# Patient Record
Sex: Female | Born: 1985 | Hispanic: No | Marital: Married | Smoking: Never smoker
Health system: Southern US, Community
[De-identification: ages and names within clinical notes are randomized; demographics above are authoritative.]

## PROBLEM LIST (undated history)

## (undated) DIAGNOSIS — N750 Cyst of Bartholin's gland: Secondary | ICD-10-CM

## (undated) DIAGNOSIS — Z87440 Personal history of urinary (tract) infections: Secondary | ICD-10-CM

## (undated) DIAGNOSIS — O139 Gestational [pregnancy-induced] hypertension without significant proteinuria, unspecified trimester: Secondary | ICD-10-CM

## (undated) DIAGNOSIS — N390 Urinary tract infection, site not specified: Secondary | ICD-10-CM

## (undated) HISTORY — PX: ABCESS DRAINAGE: SHX399

## (undated) HISTORY — DX: Personal history of urinary (tract) infections: Z87.440

---
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
## 2015-02-07 ENCOUNTER — Ambulatory Visit (INDEPENDENT_AMBULATORY_CARE_PROVIDER_SITE_OTHER): Payer: BLUE CROSS/BLUE SHIELD | Admitting: Obstetrics & Gynecology

## 2015-02-07 ENCOUNTER — Encounter: Payer: Self-pay | Admitting: Obstetrics & Gynecology

## 2015-02-07 VITALS — BP 127/81 | HR 110 | Ht 63.0 in | Wt 219.0 lb

## 2015-02-07 DIAGNOSIS — N750 Cyst of Bartholin's gland: Secondary | ICD-10-CM | POA: Diagnosis not present

## 2015-02-07 MED ORDER — TRAMADOL HCL 50 MG PO TABS: 50.0000 mg | ORAL_TABLET | Freq: Four times a day (QID) | ORAL | Status: DC | PRN

## 2015-02-07 MED ORDER — CEPHALEXIN 500 MG PO CAPS: 500.0000 mg | ORAL_CAPSULE | Freq: Four times a day (QID) | ORAL | Status: DC

## 2015-02-07 NOTE — Patient Instructions (Signed)
Bartholin's Cyst or Abscess °Bartholin's glands are small glands located within the folds of skin (labia) along the sides of the lower opening of the vagina (birth canal). A cyst may develop when the duct of the gland becomes blocked. When this happens, fluid that accumulates within the cyst can become infected. This is known as an abscess. The Bartholin gland produces a mucous fluid to lubricate the outside of the vagina during sexual intercourse. °SYMPTOMS  °· Patients with a small cyst may not have any symptoms. °· Mild discomfort to severe pain depending on the size of the cyst and if it is infected (abscess). °· Pain, redness, and swelling around the lower opening of the vagina. °· Painful intercourse. °· Pressure in the perineal area. °· Swelling of the lips of the vagina (labia). °· The cyst or abscess can be on one side or both sides of the vagina. °DIAGNOSIS  °· A large swelling is seen in the lower vagina area by your caregiver. °· Painful to touch. °· Redness and pain, if it is an abscess. °TREATMENT  °· Sometimes the cyst will go away on its own. °· Apply warm wet compresses to the area or take hot sitz baths several times a day. °· An incision to drain the cyst or abscess with local anesthesia. °· Culture the pus, if it is an abscess. °· Antibiotic treatment, if it is an abscess. °· Cut open the gland and suture the edges to make the opening of the gland bigger (marsupialization). °· Remove the whole gland if the cyst or abscess returns. °PREVENTION  °· Practice good hygiene. °· Clean the vaginal area with a mild soap and soft cloth when bathing. °· Do not rub hard in the vaginal area when bathing. °· Protect the crotch area with a padded cushion if you take long bike rides or ride horses. °· Be sure you are well lubricated when you have sexual intercourse. °HOME CARE INSTRUCTIONS  °· If your cyst or abscess was opened, a small piece of gauze, or a drain, may have been placed in the wound to allow  drainage. Do not remove this gauze or drain unless directed by your caregiver. °· Wear feminine pads, not tampons, as needed for any drainage or bleeding. °· If antibiotics were prescribed, take them exactly as directed. Finish the entire course. °· Only take over-the-counter or prescription medicines for pain, discomfort, or fever as directed by your caregiver. °SEEK IMMEDIATE MEDICAL CARE IF:  °· You have an increase in pain, redness, swelling, or drainage. °· You have bleeding from the wound which results in the use of more than the number of pads suggested by your caregiver in 24 hours. °· You have chills. °· You have a fever. °· You develop any new problems (symptoms) or aggravation of your existing condition. °MAKE SURE YOU:  °· Understand these instructions. °· Will watch your condition. °· Will get help right away if you are not doing well or get worse. °Document Released: 04/30/2005 Document Revised: 07/23/2011 Document Reviewed: 12/17/2007 °ExitCare® Patient Information ©2015 [employer]. This information is not intended to replace advice given to you by your health care provider. Make sure you discuss any questions you have with your health care provider. ° °

## 2015-02-07 NOTE — Progress Notes (Signed)
Patient is new patient and has never been to our office. Patient called after hours line this weekend and complained of vaginal pain that has worsened the last two- three days. Patient also not had a period since July and took a pregnancy test yesterday and it was positive. Armandina Stammer RN BSN

## 2015-02-07 NOTE — Progress Notes (Signed)
CLINIC ENCOUNTER NOTE  History:  29 y.o. G1P0 here today for evaluation of worsening vaginal cyst.  Cyst was noted four days ago.  It has become bigger over time, making it hard to sit or walk. It is very tender. She denies any fevers, abnormal vaginal discharge, bleeding, pelvic pain or other concerns.   Of note, patient reported LMP of 11/29/14 and had a positive home UPT; this makes here exactly [redacted]w[redacted]d today.  She is usually followed at [facility]; unsure if she will go there or here for prenatal care  History reviewed. No pertinent past medical history.  History reviewed. No pertinent past surgical history.  The following portions of the patient's history were reviewed and updated as appropriate: allergies, current medications, past family history, past medical history, past social history, past surgical history and problem list.   Health Maintenance:  Normal pap last year  Review of Systems:  Pertinent items are noted in HPI. Comprehensive review of systems was otherwise negative.  Objective:  Physical Exam BP 127/81 mmHg  Pulse 110  Ht  (1.6 m)  Wt 219 lb (99.338 kg)  BMI 38.80 kg/m2  LMP 11/29/2014 (Within Weeks) CONSTITUTIONAL: Well-developed, well-nourished female in no acute distress.  HENT:  Normocephalic, atraumatic. External right and left ear normal. Oropharynx is clear and moist EYES: Conjunctivae and EOM are normal. Pupils are equal, round, and reactive to light. No scleral icterus.  NECK: Normal range of motion, supple, no masses SKIN: Skin is warm and dry. No rash noted. Not diaphoretic. No erythema. No pallor. NEUROLGIC: Alert and oriented to person, place, and time. Normal reflexes, muscle tone coordination. No cranial nerve deficit noted. PSYCHIATRIC: Normal mood and affect. Normal behavior. Normal judgment and thought content. CARDIOVASCULAR: Normal heart rate noted RESPIRATORY: Effort and breath sounds normal, no problems with respiration noted ABDOMEN:  Soft, no distention noted.   MUSCULOSKELETAL: Normal range of motion. No edema noted. PELVIC: Normal appearing external genitalia except enlarged left Bartholin's gland measuring about 3 cm in size.  Very tender to touch, mild surrounding erythema noted.  Fluctuance of cyst noted.  No abnormal discharge noted.     Bartholin Gland Cyst I&D Enlarged abscess palpated in front of the hymenal ring around 5 o' clock.  Written informed consent was obtained.  Discussed complications and possible outcomes of procedure including recurrence of cyst, scarring leading to infection, bleeding, dyspareunia, distortion of anatomy.  Patient was examined in the dorsal lithotomy position and mass was identified.  The area was prepped with Iodine and draped in a sterile manner. 1% Lidocaine (3 ml) was then used to infiltrate area on top of the cyst, behind the hymenal ring.  A 7 mm incision was made using a sterile scapel. Upon palpation of the mass, a moderate amount of bloody, purulent, malodorous drainage was expressed through the incision.  Patient tolerated the procedure well, reported feeling " a lot better."   Assessment & Plan:  Bartholin's gland cyst now s/p I&D - Keflex 500 mg po qid  x 7 days for treatment - Recommended Sitz baths prn; Tylenol and Toradol (prescribed)  prn pain.   She was told to call to be examined if she experiences increasing swelling, pain, vaginal discharge, or fever.  - She was instructed to wear a peripad to absorb discharge, and to maintain pelvic rest  - She will return in 2 weeks for reevaluation; earlier if needed. Advised to start taking prenatal vitamins and start prenatal care. Please refer to After Visit  Summary for other counseling recommendations.    Total face-to-face time with patient: 20 minutes. Over 50% of encounter was spent on counseling and coordination of care.   Jaynie Collins, MD, FACOG Attending Obstetrician & Gynecologist, [facility]

## 2015-02-13 ENCOUNTER — Encounter (HOSPITAL_COMMUNITY): Payer: Self-pay | Admitting: Emergency Medicine

## 2015-02-13 ENCOUNTER — Emergency Department (HOSPITAL_COMMUNITY)
Admission: EM | Admit: 2015-02-13 | Discharge: 2015-02-13 | Disposition: A | Discharge status: Home / Self Care | Payer: BLUE CROSS/BLUE SHIELD | Attending: Emergency Medicine | Admitting: Emergency Medicine

## 2015-02-13 DIAGNOSIS — O9989 Other specified diseases and conditions complicating pregnancy, childbirth and the puerperium: Secondary | ICD-10-CM | POA: Diagnosis present

## 2015-02-13 DIAGNOSIS — N750 Cyst of Bartholin's gland: Secondary | ICD-10-CM

## 2015-02-13 DIAGNOSIS — Z79899 Other long term (current) drug therapy: Secondary | ICD-10-CM | POA: Diagnosis not present

## 2015-02-13 DIAGNOSIS — Z3A01 Less than 8 weeks gestation of pregnancy: Secondary | ICD-10-CM | POA: Diagnosis not present

## 2015-02-13 MED ORDER — ONDANSETRON HCL 4 MG PO TABS
4.0000 mg | ORAL_TABLET | Freq: Once | ORAL | Status: AC
  Administered 2015-02-13: 4 mg via ORAL
  Filled 2015-02-13: qty 1

## 2015-02-13 MED ORDER — LIDOCAINE HCL (PF) 1 % IJ SOLN
5.0000 mL | Freq: Once | INTRAMUSCULAR | Status: DC
  Filled 2015-02-13: qty 5

## 2015-02-13 MED ORDER — OXYCODONE-ACETAMINOPHEN 5-325 MG PO TABS
1.0000 | ORAL_TABLET | Freq: Once | ORAL | Status: AC
  Administered 2015-02-13: 1 via ORAL
  Filled 2015-02-13: qty 1

## 2015-02-13 MED ORDER — OXYCODONE-ACETAMINOPHEN 5-325 MG PO TABS
1.0000 | ORAL_TABLET | Freq: Once | ORAL | Status: AC
Start: 2015-02-13 — End: 2015-02-13
  Administered 2015-02-13: 1 via ORAL
  Filled 2015-02-13: qty 1

## 2015-02-13 NOTE — ED Notes (Signed)
Pt sts vaginal cyst that was drained last Monday is having increasing pain again today

## 2015-02-13 NOTE — ED Provider Notes (Signed)
CSN: [id number]     Arrival date & time 02/13/15  1427 History   First MD Initiated Contact with Patient 02/13/15 1620     Chief Complaint  Patient presents with  . Cyst    The history is provided by the patient, the spouse and medical records.     29 year old female presenting with vaginal pain. Onset about one week ago. Located in the left outer labia. Severe. Worsening over past couple days. Context- patient seen in OB clinic on 9/26 and found to have a Bartholin gland cyst, was drained in office but no catheter was left in, states initial improvement after drainage and now pain and swelling has returned over past 2 days. Not alleviated by Tylenol. Associated nausea and some emesis though patient states she thinks this is related to her pregnancy. Pregnancy was recently discovered at office visit as well and patient states she is about 5 weeks' gestational age. Denies fever. States compliant with Keflex given at [facility] appointment.   History reviewed. No pertinent past medical history. History reviewed. No pertinent past surgical history. Family History  Problem Relation Age of Onset  . Diabetes Mother   . Hypertension Mother   . Hypertension Father   . Cancer Neg Hx   . Stroke Neg Hx    Social History  Substance Use Topics  . Smoking status: Never Smoker   . Smokeless tobacco: None  . Alcohol Use: No   OB History    Gravida Para Term Preterm AB TAB SAB Ectopic Multiple Living   1 0             Review of Systems  Constitutional: Negative for fever.  HENT: Negative for rhinorrhea.   Eyes: Negative for visual disturbance.  Respiratory: Negative for shortness of breath.   Cardiovascular: Negative for chest pain.  Gastrointestinal: Negative for vomiting and abdominal pain.  Genitourinary: Positive for vaginal pain. Negative for decreased urine volume, vaginal bleeding and vaginal discharge.  Skin: Positive for wound.  Allergic/Immunologic: Negative for immunocompromised state.    Neurological: Negative for syncope.  Psychiatric/Behavioral: Negative for confusion.      Allergies  Review of patient's allergies indicates no known allergies.  Home Medications   Prior to Admission medications   Medication Sig Start Date End Date Taking? Authorizing Provider  cephALEXin (KEFLEX) 500 MG capsule Take 1 capsule (500 mg total) by mouth 4 (four) times daily. Patient taking differently: Take 500 mg by mouth 4 (four) times daily. 7 day course started 02/07/15 02/07/15  Yes Tereso Newcomer, MD  Prenatal Vit-Fe Fumarate-FA (PRENATAL MULTIVITAMIN) TABS tablet Take 1 tablet by mouth daily at 12 noon.   Yes Historical Provider, MD  traMADol (ULTRAM) 50 MG tablet Take 1 tablet (50 mg total) by mouth every 6 (six) hours as needed for severe pain. Patient not taking: Reported on 02/13/2015 02/07/15   Tereso Newcomer, MD   BP 131/94 mmHg  Pulse 116  Temp(Src) 99.4 F (37.4 C) (Oral)  Resp 18  Ht  (1.6 m)  Wt 222 lb 11.2 oz (101.016 kg)  BMI 39.46 kg/m2  SpO2 100%  LMP 11/29/2014 (Within Weeks) Physical Exam  Constitutional: She is oriented to person, place, and time. She appears well-developed and well-nourished. No distress.  HENT:  Head: Normocephalic and atraumatic.  Eyes: Right eye exhibits no discharge. Left eye exhibits no discharge.  Neck: No tracheal deviation present.  Cardiovascular: Normal rate and regular rhythm.   Pulmonary/Chest: Effort normal and breath sounds normal.  No respiratory distress.  Abdominal: Soft. She exhibits no distension. There is no tenderness.  Genitourinary:     Tender, enlarged, indurated collection in left labia, no spontaneous drainage noted    Musculoskeletal: She exhibits no edema.  Neurological: She is alert and oriented to person, place, and time.  Skin: Skin is warm and dry.  Psychiatric: She has a normal mood and affect. Her behavior is normal.    ED Course  INCISION AND DRAINAGE Performed by: Urban Gibson Authorized by: Urban Gibson Consent: Verbal consent obtained. Risks and benefits: risks, benefits and alternatives were discussed Consent given by: patient Patient understanding: patient states understanding of the procedure being performed Patient identity confirmed: arm band, provided demographic data and hospital-assigned identification number Time out: Immediately prior to procedure a "time out" was called to verify the correct patient, procedure, equipment, support staff and site/side marked as required. Type: abscess Body area: anogenital Location details: Bartholin's gland Anesthesia: local infiltration Local anesthetic: lidocaine 1% without epinephrine Patient sedated: no Scalpel size: 11 Incision type: single straight Complexity: simple Drainage: purulent Drainage amount: copious Wound treatment: drain placed and  wound left open (wards ) Patient tolerance: Patient tolerated the procedure well with no immediate complications   (including critical care time) Labs Review Labs Reviewed - No data to display  Imaging Review No results found. I have personally reviewed and evaluated these images and lab results as part of my medical decision-making.   EKG Interpretation None      MDM   Final diagnoses:  Bartholin cyst    29 year old female with history of recent drainage of Bartholin's gland cyst presenting with recurrent fluid collection and pain in vaginal area. Found to have re-collection of cyst fluid. Drained at bedside and wards catheter placed, as above. No evidence of systemic infection, overall well appearing and tolerating by mouth. Has appropriate antibiotics as well as pain medications to use as needed. Discharged home with return precautions, close f/u with OB/Gyn.   Case discussed with Dr. Rubin Payor who oversaw management of this pt.    Urban Gibson, MD 02/14/15 4098  Benjiman Core, MD 02/14/15 [phone]

## 2015-02-13 NOTE — Discharge Instructions (Signed)
Bartholin's Cyst or Abscess °Bartholin's glands are small glands located within the folds of skin (labia) along the sides of the lower opening of the vagina (birth canal). A cyst may develop when the duct of the gland becomes blocked. When this happens, fluid that accumulates within the cyst can become infected. This is known as an abscess. The Bartholin gland produces a mucous fluid to lubricate the outside of the vagina during sexual intercourse. °SYMPTOMS  °· Patients with a small cyst may not have any symptoms. °· Mild discomfort to severe pain depending on the size of the cyst and if it is infected (abscess). °· Pain, redness, and swelling around the lower opening of the vagina. °· Painful intercourse. °· Pressure in the perineal area. °· Swelling of the lips of the vagina (labia). °· The cyst or abscess can be on one side or both sides of the vagina. °DIAGNOSIS  °· A large swelling is seen in the lower vagina area by your caregiver. °· Painful to touch. °· Redness and pain, if it is an abscess. °TREATMENT  °· Sometimes the cyst will go away on its own. °· Apply warm wet compresses to the area or take hot sitz baths several times a day. °· An incision to drain the cyst or abscess with local anesthesia. °· Culture the pus, if it is an abscess. °· Antibiotic treatment, if it is an abscess. °· Cut open the gland and suture the edges to make the opening of the gland bigger (marsupialization). °· Remove the whole gland if the cyst or abscess returns. °PREVENTION  °· Practice good hygiene. °· Clean the vaginal area with a mild soap and soft cloth when bathing. °· Do not rub hard in the vaginal area when bathing. °· Protect the crotch area with a padded cushion if you take long bike rides or ride horses. °· Be sure you are well lubricated when you have sexual intercourse. °HOME CARE INSTRUCTIONS  °· If your cyst or abscess was opened, a small piece of gauze, or a drain, may have been placed in the wound to allow  drainage. Do not remove this gauze or drain unless directed by your caregiver. °· Wear feminine pads, not tampons, as needed for any drainage or bleeding. °· If antibiotics were prescribed, take them exactly as directed. Finish the entire course. °· Only take over-the-counter or prescription medicines for pain, discomfort, or fever as directed by your caregiver. °SEEK IMMEDIATE MEDICAL CARE IF:  °· You have an increase in pain, redness, swelling, or drainage. °· You have bleeding from the wound which results in the use of more than the number of pads suggested by your caregiver in 24 hours. °· You have chills. °· You have a fever. °· You develop any new problems (symptoms) or aggravation of your existing condition. °MAKE SURE YOU:  °· Understand these instructions. °· Will watch your condition. °· Will get help right away if you are not doing well or get worse. °Document Released: 04/30/2005 Document Revised: 07/23/2011 Document Reviewed: 12/17/2007 °ExitCare® Patient Information ©2015 [employer]. This information is not intended to replace advice given to you by your health care provider. Make sure you discuss any questions you have with your health care provider. ° °

## 2015-02-21 ENCOUNTER — Ambulatory Visit (INDEPENDENT_AMBULATORY_CARE_PROVIDER_SITE_OTHER): Payer: BLUE CROSS/BLUE SHIELD | Admitting: Obstetrics & Gynecology

## 2015-02-21 DIAGNOSIS — N750 Cyst of Bartholin's gland: Secondary | ICD-10-CM

## 2015-02-21 NOTE — Progress Notes (Signed)
   Subjective:    Patient ID: Julia Acosta, female    DOB: 02-27-1986, 29 y.o.   MRN: [id number]  HPI 29 yo M G1 here for follow up for her Bartholin's abscess. She had a I&D here with Dr. Macon Large but it filled back up again. She went to the ER and they placed a Word catheter. She reports that it is still draining.   Review of Systems     Objective:   Physical Exam WNWHFNAD Breathing, conversing, and ambulating again Her Word catheter is in place       Assessment & Plan:  Bartholin's abscess- continue catheter drainage. RTC 1 week for removal Prenatal care- schedule NOB visit

## 2015-03-02 ENCOUNTER — Ambulatory Visit (INDEPENDENT_AMBULATORY_CARE_PROVIDER_SITE_OTHER): Payer: BLUE CROSS/BLUE SHIELD | Admitting: Obstetrics & Gynecology

## 2015-03-02 ENCOUNTER — Encounter: Payer: Self-pay | Admitting: Obstetrics & Gynecology

## 2015-03-02 VITALS — BP 122/73 | HR 69 | Ht 63.0 in | Wt 223.0 lb

## 2015-03-02 DIAGNOSIS — N750 Cyst of Bartholin's gland: Secondary | ICD-10-CM

## 2015-03-02 NOTE — Progress Notes (Signed)
   Subjective:    Patient ID: Julia Acosta, female    DOB: May 06, 1986, 29 y.o.   MRN: [id number]  HPI  She is here to have her Word catheter removed. She is still taking antibiotics.  Review of Systems     Objective:   Physical Exam WNWHFNAD Breathing, conversing, and ambulating normally I removed her catheter. There doesn't appear to be any more infection.       Assessment & Plan:  Bartholin's abscess- now opened Rec stop antibiotics and start a probiotic RTC for annual in a few months or prn sooner

## 2015-03-14 ENCOUNTER — Encounter: Payer: Self-pay | Admitting: Obstetrics & Gynecology

## 2015-03-14 ENCOUNTER — Ambulatory Visit (INDEPENDENT_AMBULATORY_CARE_PROVIDER_SITE_OTHER): Payer: BLUE CROSS/BLUE SHIELD | Admitting: Obstetrics & Gynecology

## 2015-03-14 VITALS — BP 103/78 | HR 94 | Wt 221.0 lb

## 2015-03-14 DIAGNOSIS — Z3491 Encounter for supervision of normal pregnancy, unspecified, first trimester: Secondary | ICD-10-CM

## 2015-03-14 DIAGNOSIS — O2341 Unspecified infection of urinary tract in pregnancy, first trimester: Secondary | ICD-10-CM

## 2015-03-14 DIAGNOSIS — Z3490 Encounter for supervision of normal pregnancy, unspecified, unspecified trimester: Secondary | ICD-10-CM | POA: Insufficient documentation

## 2015-03-14 DIAGNOSIS — Z113 Encounter for screening for infections with a predominantly sexual mode of transmission: Secondary | ICD-10-CM

## 2015-03-14 DIAGNOSIS — Z124 Encounter for screening for malignant neoplasm of cervix: Secondary | ICD-10-CM

## 2015-03-14 DIAGNOSIS — Z3401 Encounter for supervision of normal first pregnancy, first trimester: Secondary | ICD-10-CM | POA: Diagnosis not present

## 2015-03-14 DIAGNOSIS — Z23 Encounter for immunization: Secondary | ICD-10-CM

## 2015-03-14 DIAGNOSIS — B951 Streptococcus, group B, as the cause of diseases classified elsewhere: Secondary | ICD-10-CM

## 2015-03-14 NOTE — Addendum Note (Signed)
Addended by: Anell BarrHOWARD, Aeryn Medici L on: 03/14/2015 11:00 AM   Modules accepted: Orders

## 2015-03-14 NOTE — Progress Notes (Signed)
    Subjective:    Julia Acosta is a 29 y.o. G1P0 at 5768w0d being seen today for her first obstetrical visit.  Patient does intend to breast feed. Pregnancy history fully reviewed.  Patient reports one episode of pink discharge.  Bartholin's cyst has resolved; patient had been seen for this last month.Ceasar Mons.  Filed Vitals:   03/14/15 0811  BP: 103/78  Pulse: 94  Weight: 221 lb (100.245 kg)    HISTORY: OB History  Gravida Para Term Preterm AB SAB TAB Ectopic Multiple Living  1 0            # Outcome Date GA Lbr Len/2nd Weight Sex Delivery Anes PTL Lv  1 Current              History reviewed. No pertinent past medical history. No past surgical history on file. Family History  Problem Relation Age of Onset  . Diabetes Mother   . Hypertension Mother   . Hypertension Father   . Diabetes Father   . Hyperlipidemia Father   . Cancer Neg Hx   . Stroke Neg Hx      Exam    Uterus:     Pelvic Exam:    Perineum: No Hemorrhoids, Normal Perineum   Vulva: normal, Bartholin's I&D site is well-healed   Vagina:  normal mucosa, normal discharge   Cervix: anteverted, no cervical motion tenderness, no lesions and prominent ectropion that was very friable and had small amount of bleeding during pap smear   Adnexa: normal adnexa and no mass, fullness, tenderness   Bony Pelvis: average  System: Breast:  normal appearance, no masses or tenderness, Inspection negative   Skin: normal coloration and turgor, no rashes   Neurologic: oriented, normal, negative, normal mood   Extremities: normal strength, tone, and muscle mass, ROM of all joints is normal   HEENT PERRLA, extra ocular movement intact and sclera clear, anicteric   Mouth/Teeth mucous membranes moist, pharynx normal without lesions and dental hygiene good   Neck supple and no masses   Cardiovascular: regular rate and rhythm   Respiratory:  appears well, vitals normal, no respiratory distress, acyanotic, normal RR, chest clear, no  wheezing, crepitations, rhonchi, normal symmetric air entry   Abdomen: soft, non-tender; bowel sounds normal; no masses,  no organomegaly   Urinary: urethral meatus normal   Clinic scan: Viable SIUP with CRL ~7111w3d   Assessment:    Pregnancy: G1P0 Patient Active Problem List   Diagnosis Date Noted  . Supervision of normal pregnancy 03/14/2015        Plan:   Initial labs drawn. Continue prenatal vitamins. Problem list reviewed and updated. Genetic Screening discussed First Screen: ordered. Ultrasound discussed; fetal survey: to be ordered later. The nature of  [facility] with multiple MDs and other Advanced Practice Providers was explained to patient; also emphasized that residents, students are part of our team. Follow up in 4 weeks.  Routine obstetric precautions reviewed.    Tereso NewcomerANYANWU,Masao Junker A, MD 03/14/2015

## 2015-03-14 NOTE — Patient Instructions (Signed)
First Trimester of Pregnancy The first trimester of pregnancy is from week 1 until the end of week 12 (months 1 through 3). A week after a sperm fertilizes an egg, the egg will implant on the wall of the uterus. This embryo will begin to develop into a baby. Genes from you and your partner are forming the baby. The female genes determine whether the baby is a boy or a girl. At 6-8 weeks, the eyes and face are formed, and the heartbeat can be seen on ultrasound. At the end of 12 weeks, all the baby's organs are formed.  Now that you are pregnant, you will want to do everything you can to have a healthy baby. Two of the most important things are to get good prenatal care and to follow your health care provider's instructions. Prenatal care is all the medical care you receive before the baby's birth. This care will help prevent, find, and treat any problems during the pregnancy and childbirth. BODY CHANGES Your body goes through many changes during pregnancy. The changes vary from woman to woman.   You may gain or lose a couple of pounds at first.  You may feel sick to your stomach (nauseous) and throw up (vomit). If the vomiting is uncontrollable, call your health care provider.  You may tire easily.  You may develop headaches that can be relieved by medicines approved by your health care provider.  You may urinate more often. Painful urination may mean you have a bladder infection.  You may develop heartburn as a result of your pregnancy.  You may develop constipation because certain hormones are causing the muscles that push waste through your intestines to slow down.  You may develop hemorrhoids or swollen, bulging veins (varicose veins).  Your breasts may begin to grow larger and become tender. Your nipples may stick out more, and the tissue that surrounds them (areola) may become darker.  Your gums may bleed and may be sensitive to brushing and flossing.  Dark spots or blotches (chloasma,  mask of pregnancy) may develop on your face. This will likely fade after the baby is born.  Your menstrual periods will stop.  You may have a loss of appetite.  You may develop cravings for certain kinds of food.  You may have changes in your emotions from day to day, such as being excited to be pregnant or being concerned that something may go wrong with the pregnancy and baby.  You may have more vivid and strange dreams.  You may have changes in your hair. These can include thickening of your hair, rapid growth, and changes in texture. Some women also have hair loss during or after pregnancy, or hair that feels dry or thin. Your hair will most likely return to normal after your baby is born. WHAT TO EXPECT AT YOUR PRENATAL VISITS During a routine prenatal visit:  You will be weighed to make sure you and the baby are growing normally.  Your blood pressure will be taken.  Your abdomen will be measured to track your baby's growth.  The fetal heartbeat will be listened to starting around week 10 or 12 of your pregnancy.  Test results from any previous visits will be discussed. Your health care provider may ask you:  How you are feeling.  If you are feeling the baby move.  If you have had any abnormal symptoms, such as leaking fluid, bleeding, severe headaches, or abdominal cramping.  If you are using any tobacco products,   including cigarettes, chewing tobacco, and electronic cigarettes.  If you have any questions. Other tests that may be performed during your first trimester include:  Blood tests to find your blood type and to check for the presence of any previous infections. They will also be used to check for low iron levels (anemia) and Rh antibodies. Later in the pregnancy, blood tests for diabetes will be done along with other tests if problems develop.  Urine tests to check for infections, diabetes, or protein in the urine.  An ultrasound to confirm the proper growth  and development of the baby.  An amniocentesis to check for possible genetic problems.  Fetal screens for spina bifida and Down syndrome.  You may need other tests to make sure you and the baby are doing well.  HIV (human immunodeficiency virus) testing. Routine prenatal testing includes screening for HIV, unless you choose not to have this test. HOME CARE INSTRUCTIONS  Medicines  Follow your health care provider's instructions regarding medicine use. Specific medicines may be either safe or unsafe to take during pregnancy.  Take your prenatal vitamins as directed.  If you develop constipation, try taking a stool softener if your health care provider approves. Diet  Eat regular, well-balanced meals. Choose a variety of foods, such as meat or vegetable-based protein, fish, milk and low-fat dairy products, vegetables, fruits, and whole grain breads and cereals. Your health care provider will help you determine the amount of weight gain that is right for you.  Avoid raw meat and uncooked cheese. These carry germs that can cause birth defects in the baby.  Eating four or five small meals rather than three large meals a day may help relieve nausea and vomiting. If you start to feel nauseous, eating a few soda crackers can be helpful. Drinking liquids between meals instead of during meals also seems to help nausea and vomiting.  If you develop constipation, eat more high-fiber foods, such as fresh vegetables or fruit and whole grains. Drink enough fluids to keep your urine clear or pale yellow. Activity and Exercise  Exercise only as directed by your health care provider. Exercising will help you:  Control your weight.  Stay in shape.  Be prepared for labor and delivery.  Experiencing pain or cramping in the lower abdomen or low back is a good sign that you should stop exercising. Check with your health care provider before continuing normal exercises.  Try to avoid standing for long  periods of time. Move your legs often if you must stand in one place for a long time.  Avoid heavy lifting.  Wear low-heeled shoes, and practice good posture.  You may continue to have sex unless your health care provider directs you otherwise. Relief of Pain or Discomfort  Wear a good support bra for breast tenderness.   Take warm sitz baths to soothe any pain or discomfort caused by hemorrhoids. Use hemorrhoid cream if your health care provider approves.   Rest with your legs elevated if you have leg cramps or low back pain.  If you develop varicose veins in your legs, wear support hose. Elevate your feet for 15 minutes, 3-4 times a day. Limit salt in your diet. Prenatal Care  Schedule your prenatal visits by the twelfth week of pregnancy. They are usually scheduled monthly at first, then more often in the last 2 months before delivery.  Write down your questions. Take them to your prenatal visits.  Keep all your prenatal visits as directed by your   health care provider. Safety  Wear your seat belt at all times when driving.  Make a list of emergency phone numbers, including numbers for family, friends, the hospital, and police and fire departments. General Tips  Ask your health care provider for a referral to a local prenatal education class. Begin classes no later than at the beginning of month 6 of your pregnancy.  Ask for help if you have counseling or nutritional needs during pregnancy. Your health care provider can offer advice or refer you to specialists for help with various needs.  Do not use hot tubs, steam rooms, or saunas.  Do not douche or use tampons or scented sanitary pads.  Do not cross your legs for long periods of time.  Avoid cat litter boxes and soil used by cats. These carry germs that can cause birth defects in the baby and possibly loss of the fetus by miscarriage or stillbirth.  Avoid all smoking, herbs, alcohol, and medicines not prescribed by  your health care provider. Chemicals in these affect the formation and growth of the baby.  Do not use any tobacco products, including cigarettes, chewing tobacco, and electronic cigarettes. If you need help quitting, ask your health care provider. You may receive counseling support and other resources to help you quit.  Schedule a dentist appointment. At home, brush your teeth with a soft toothbrush and be gentle when you floss. SEEK MEDICAL CARE IF:   You have dizziness.  You have mild pelvic cramps, pelvic pressure, or nagging pain in the abdominal area.  You have persistent nausea, vomiting, or diarrhea.  You have a bad smelling vaginal discharge.  You have pain with urination.  You notice increased swelling in your face, hands, legs, or ankles. SEEK IMMEDIATE MEDICAL CARE IF:   You have a fever.  You are leaking fluid from your vagina.  You have spotting or bleeding from your vagina.  You have severe abdominal cramping or pain.  You have rapid weight gain or loss.  You vomit blood or material that looks like coffee grounds.  You are exposed to German measles and have never had them.  You are exposed to fifth disease or chickenpox.  You develop a severe headache.  You have shortness of breath.  You have any kind of trauma, such as from a fall or a car accident.   This information is not intended to replace advice given to you by your health care provider. Make sure you discuss any questions you have with your health care provider.   Document Released: 04/24/2001 Document Revised: 05/21/2014 Document Reviewed: 03/10/2013 Elsevier Interactive Patient Education 2016 [employer]  

## 2015-03-15 LAB — PRENATAL PROFILE (SOLSTAS)
Antibody Screen: NEGATIVE
BASOS ABS: 0 10*3/uL (ref 0.0–0.1)
Basophils Relative: 0 % (ref 0–1)
EOS PCT: 1 % (ref 0–5)
Eosinophils Absolute: 0.1 10*3/uL (ref 0.0–0.7)
HCT: 34.4 % — ABNORMAL LOW (ref 36.0–46.0)
HIV: NONREACTIVE
Hemoglobin: 11.4 g/dL — ABNORMAL LOW (ref 12.0–15.0)
Hepatitis B Surface Ag: NEGATIVE
LYMPHS ABS: 2 10*3/uL (ref 0.7–4.0)
LYMPHS PCT: 24 % (ref 12–46)
MCH: 26.4 pg (ref 26.0–34.0)
MCHC: 33.1 g/dL (ref 30.0–36.0)
MCV: 79.6 fL (ref 78.0–100.0)
MPV: 9.2 fL (ref 8.6–12.4)
Monocytes Absolute: 0.7 10*3/uL (ref 0.1–1.0)
Monocytes Relative: 8 % (ref 3–12)
Neutro Abs: 5.6 10*3/uL (ref 1.7–7.7)
Neutrophils Relative %: 67 % (ref 43–77)
PLATELETS: 358 10*3/uL (ref 150–400)
RBC: 4.32 MIL/uL (ref 3.87–5.11)
RDW: 15.4 % (ref 11.5–15.5)
RH TYPE: POSITIVE
Rubella: 1.99 Index — ABNORMAL HIGH (ref <0.90)
WBC: 8.3 10*3/uL (ref 4.0–10.5)

## 2015-03-15 LAB — GC/CHLAMYDIA PROBE AMP (~~LOC~~) NOT AT ARMC
CHLAMYDIA, DNA PROBE: NEGATIVE
NEISSERIA GONORRHEA: NEGATIVE

## 2015-03-16 ENCOUNTER — Telehealth: Payer: Self-pay

## 2015-03-16 DIAGNOSIS — O2340 Unspecified infection of urinary tract in pregnancy, unspecified trimester: Secondary | ICD-10-CM

## 2015-03-16 DIAGNOSIS — B951 Streptococcus, group B, as the cause of diseases classified elsewhere: Secondary | ICD-10-CM | POA: Insufficient documentation

## 2015-03-16 LAB — HEMOGLOBINOPATHY EVALUATION
HGB F QUANT: 0 % (ref 0.0–2.0)
Hemoglobin Other: 0 %
Hgb A2 Quant: 2.4 % (ref 2.2–3.2)
Hgb A: 97.6 % (ref 96.8–97.8)
Hgb S Quant: 0 %

## 2015-03-16 LAB — CULTURE, OB URINE: Colony Count: >=100000

## 2015-03-16 LAB — CYTOLOGY - PAP

## 2015-03-16 MED ORDER — AMOXICILLIN 500 MG PO CAPS: 500.0000 mg | ORAL_CAPSULE | Freq: Three times a day (TID) | ORAL | Status: DC

## 2015-03-16 NOTE — Addendum Note (Signed)
Addended by: Jaynie CollinsANYANWU, Shamari Lofquist A on: 03/16/2015 08:52 AM   Modules accepted: Orders

## 2015-03-16 NOTE — Telephone Encounter (Signed)
-----   Message from Tereso NewcomerUgonna A Anyanwu, MD sent at 03/16/2015  8:53 AM EDT ----- Amoxicillin prescribed for GBS UTI, will need retreatment in labor.  Please call to inform patient of results and recommendations; also advise to pick up prescription.

## 2015-03-16 NOTE — Telephone Encounter (Signed)
Patient called and notified of GBS in urine. Patient made aware that she will need to take antibiotic now to get rid of infection. paitent states understanding. Also informed her she will need treatment for GBS in labor. Patient states understanding. Julia StammerJennifer Rogen Porte RN BSN

## 2015-03-30 ENCOUNTER — Other Ambulatory Visit (INDEPENDENT_AMBULATORY_CARE_PROVIDER_SITE_OTHER): Payer: BLUE CROSS/BLUE SHIELD

## 2015-03-30 DIAGNOSIS — Z36 Encounter for antenatal screening of mother: Secondary | ICD-10-CM

## 2015-03-30 DIAGNOSIS — Z87440 Personal history of urinary (tract) infections: Secondary | ICD-10-CM

## 2015-03-30 NOTE — Addendum Note (Signed)
Addended by: Anell BarrHOWARD, JENNIFER L on: 03/30/2015 04:00 PM   Modules accepted: Orders, Medications

## 2015-03-30 NOTE — Progress Notes (Signed)
Patient requested a urine culture be done because she finished her antibiotics for her urine culture. Patient states she is not having any current uti symptoms. Patient states she is noticing an increase in nausea and vomiting in the evenings. Reminded patient to eat small frequent meals throughout the day and avoid greasy/ fatty foods. I offered patient for nausea and vomiting but denies wanting prescription at this time. Armandina StammerJennifer Ayyan Sites RN BSN

## 2015-03-31 LAB — CULTURE, URINE COMPREHENSIVE
Colony Count: NO GROWTH
ORGANISM ID, BACTERIA: NO GROWTH

## 2015-04-01 ENCOUNTER — Telehealth: Payer: Self-pay

## 2015-04-01 NOTE — Telephone Encounter (Signed)
Patient called and made aware that TOC urine culture was negative. Patient states understanding. Armandina StammerJennifer Howard RN BSN

## 2015-04-06 ENCOUNTER — Ambulatory Visit (INDEPENDENT_AMBULATORY_CARE_PROVIDER_SITE_OTHER): Payer: BLUE CROSS/BLUE SHIELD | Admitting: Obstetrics & Gynecology

## 2015-04-06 VITALS — BP 105/72 | HR 87 | Wt 221.0 lb

## 2015-04-06 DIAGNOSIS — Z3401 Encounter for supervision of normal first pregnancy, first trimester: Secondary | ICD-10-CM

## 2015-04-06 DIAGNOSIS — O99210 Obesity complicating pregnancy, unspecified trimester: Secondary | ICD-10-CM | POA: Insufficient documentation

## 2015-04-06 NOTE — Progress Notes (Signed)
Subjective:  Pola CornMaryyam Gearin is a 29 y.o. G1P0 at 27 [redacted]w[redacted]d being seen today for ongoing prenatal care.  She is currently monitored for the following issues for this low-risk pregnancy and has Supervision of normal pregnancy; GBS (group B streptococcus) UTI complicating pregnancy; and Obesity in pregnancy on her problem list.  Patient reports no complaints.   . Vag. Bleeding: None, Scant.  Movement: Absent. Denies leaking of fluid.   The following portions of the patient's history were reviewed and updated as appropriate: allergies, current medications, past family history, past medical history, past social history, past surgical history and problem list. Problem list updated.  Objective:   Filed Vitals:   04/06/15 1522  BP: 105/72  Pulse: 87  Weight: 221 lb (100.245 kg)    Fetal Status: Fetal Heart Rate (bpm): 166   Movement: Absent     General:  Alert, oriented and cooperative. Patient is in no acute distress.  Skin: Skin is warm and dry. No rash noted.   Cardiovascular: Normal heart rate noted  Respiratory: Normal respiratory effort, no problems with respiration noted  Abdomen: Soft, gravid, appropriate for gestational age. Pain/Pressure: Absent     Pelvic: Vag. Bleeding: None, Scant Vag D/C Character: Thin   Cervical exam deferred        Extremities: Normal range of motion.  Edema: None  Mental Status: Normal mood and affect. Normal behavior. Normal judgment and thought content.   Urinalysis: Urine Protein: Negative Urine Glucose: Negative  Assessment and Plan:  Pregnancy: G1P0 at 27 [redacted]w[redacted]d  1. Obesity in pregnancy -Early glucola next week  Preterm labor symptoms and general obstetric precautions including but not limited to vaginal bleeding, contractions, leaking of fluid and fetal movement were reviewed in detail with the patient. Please refer to After Visit Summary for other counseling recommendations.  Return in about 4 weeks (around 05/04/2015).   Allie BossierMyra C Shondale Quinley, MD

## 2015-04-08 ENCOUNTER — Ambulatory Visit (HOSPITAL_COMMUNITY)
Admission: RE | Admit: 2015-04-08 | Discharge: 2015-04-08 | Disposition: A | Discharge status: Home / Self Care | Payer: BLUE CROSS/BLUE SHIELD | Source: Ambulatory Visit | Attending: Obstetrics & Gynecology | Admitting: Obstetrics & Gynecology

## 2015-04-08 ENCOUNTER — Encounter (HOSPITAL_COMMUNITY): Payer: Self-pay

## 2015-04-08 DIAGNOSIS — Z3A13 13 weeks gestation of pregnancy: Secondary | ICD-10-CM | POA: Diagnosis not present

## 2015-04-08 DIAGNOSIS — Z3491 Encounter for supervision of normal pregnancy, unspecified, first trimester: Secondary | ICD-10-CM

## 2015-04-08 DIAGNOSIS — Z36 Encounter for antenatal screening of mother: Secondary | ICD-10-CM | POA: Diagnosis present

## 2015-04-11 NOTE — Addendum Note (Signed)
Addended by: Anell BarrHOWARD, JENNIFER L on: 04/11/2015 08:04 AM   Modules accepted: Orders

## 2015-04-12 LAB — GLUCOSE TOLERANCE, 1 HOUR (50G) W/O FASTING: Glucose, 1 Hour GTT: 84 mg/dL (ref 70–140)

## 2015-04-19 ENCOUNTER — Encounter: Payer: Self-pay | Admitting: Obstetrics & Gynecology

## 2015-04-21 ENCOUNTER — Other Ambulatory Visit (HOSPITAL_COMMUNITY): Payer: Self-pay

## 2015-05-04 ENCOUNTER — Ambulatory Visit (INDEPENDENT_AMBULATORY_CARE_PROVIDER_SITE_OTHER): Payer: BLUE CROSS/BLUE SHIELD | Admitting: Obstetrics & Gynecology

## 2015-05-04 VITALS — BP 123/88 | HR 79 | Wt 222.0 lb

## 2015-05-04 DIAGNOSIS — Z36 Encounter for antenatal screening of mother: Secondary | ICD-10-CM | POA: Diagnosis not present

## 2015-05-04 DIAGNOSIS — O99210 Obesity complicating pregnancy, unspecified trimester: Secondary | ICD-10-CM

## 2015-05-04 DIAGNOSIS — O2342 Unspecified infection of urinary tract in pregnancy, second trimester: Secondary | ICD-10-CM

## 2015-05-04 DIAGNOSIS — Z3492 Encounter for supervision of normal pregnancy, unspecified, second trimester: Secondary | ICD-10-CM

## 2015-05-04 DIAGNOSIS — Z3402 Encounter for supervision of normal first pregnancy, second trimester: Secondary | ICD-10-CM

## 2015-05-04 DIAGNOSIS — B951 Streptococcus, group B, as the cause of diseases classified elsewhere: Secondary | ICD-10-CM

## 2015-05-04 NOTE — Progress Notes (Signed)
Subjective:  Julia Acosta is a 29 y.o. G1P0 at 2728w6d being seen today for ongoing prenatal care.  She is currently monitored for the following issues for this low-risk pregnancy and has Supervision of normal pregnancy; GBS (group B streptococcus) UTI complicating pregnancy; and Obesity in pregnancy on her problem list.  Patient reports no complaints.   . Vag. Bleeding: None.  Movement: Absent. Denies leaking of fluid.   The following portions of the patient's history were reviewed and updated as appropriate: allergies, current medications, past family history, past medical history, past social history, past surgical history and problem list. Problem list updated.  Objective:   Filed Vitals:   05/04/15 1530  BP: 123/88  Pulse: 79  Weight: 222 lb (100.699 kg)    Fetal Status: Fetal Heart Rate (bpm): 170   Movement: Absent     General:  Alert, oriented and cooperative. Patient is in no acute distress.  Skin: Skin is warm and dry. No rash noted.   Cardiovascular: Normal heart rate noted  Respiratory: Normal respiratory effort, no problems with respiration noted  Abdomen: Soft, gravid, appropriate for gestational age. Pain/Pressure: Absent     Pelvic: Vag. Bleeding: None Vag D/C Character: Thin   Cervical exam deferred        Extremities: Normal range of motion.  Edema: None  Mental Status: Normal mood and affect. Normal behavior. Normal judgment and thought content.   Urinalysis: Urine Protein: Negative Urine Glucose: Trace  Assessment and Plan:  Pregnancy: G1P0 at 4828w6d  1. GBS (group B streptococcus) UTI complicating pregnancy, second trimester   2. Supervision of normal pregnancy, second trimester  - Alpha fetoprotein, maternal - US MFM OB COMP + 14 WK; Future  3. Obesity in pregnancy - early glucola normal  Preterm labor symptoms and general obstetric precautions including but not limited to vaginal bleeding, contractions, leaking of fluid and fetal movement were  reviewed in detail with the patient. Please refer to After Visit Summary for other counseling recommendations.  Return in about 4 weeks (around 06/01/2015).   Allie BossierMyra C Keymarion Bearman, MD

## 2015-05-10 ENCOUNTER — Telehealth: Payer: Self-pay | Admitting: *Deleted

## 2015-05-10 DIAGNOSIS — Z3492 Encounter for supervision of normal pregnancy, unspecified, second trimester: Secondary | ICD-10-CM

## 2015-05-10 LAB — ALPHA FETOPROTEIN, MATERNAL
AFP: 50.2 ng/mL
Curr Gest Age: 16.2 wks.days
MOM FOR AFP: 1.96
Open Spina bifida: NEGATIVE
Osb Risk: 1:836 {titer}

## 2015-05-10 NOTE — Telephone Encounter (Signed)
Pt notified of normal AFP. 

## 2015-05-15 NOTE — L&D Delivery Note (Signed)
Patient is 30 y.o. G1P0 2914w0d admitted for PROM.   Delivery Note At 12:37 AM a viable female was delivered via Vaginal, Spontaneous Delivery (Presentation: ; Occiput Anterior).  APGAR: 7, 8; weight 6 lb 8.8 oz (2970 g).   Placenta status: Intact, Spontaneous Pathology.  Cord: 3 vessels with the following complications: None.   Baby noted to have tachycardia and tachypnea. Neonatologist called and recommended transfer to NICU for further monitoring and evaluation.   Anesthesia: Epidural Local  Episiotomy: None Lacerations: 2nd Degree, Vaginal   Suture Repair: 3.0 vicryl Est. Blood Loss (mL):  400cc  Mom to postpartum.  Baby to NICU.  [facility] Rumley 10/06/2015, 1:39 AM   Patient is a G1 at 4714w0d who was admitted w/ SROM and early labor, significant hx of EFW 8lbs x 3 weeks ago but essentially uncomplicated prenatal course.  She progressed with augmentation via Pitocin. T of 101.2 noted prior to delivery; one dose Amp infusing during delivery.  I was gloved and present for delivery in its entirety.  Second stage of labor progressed to SVD.  Mild decels during second stage noted.  Complications: Neo called down at 8 mins of age to assess infant due to tachycardia/resp issues; repair by Dr Duayne CalEure    SHAW, KIMBERLY, CNM 8:40 AM  10/06/2015

## 2015-05-17 ENCOUNTER — Ambulatory Visit (HOSPITAL_COMMUNITY): Payer: BLUE CROSS/BLUE SHIELD

## 2015-05-26 ENCOUNTER — Other Ambulatory Visit: Payer: Self-pay | Admitting: Obstetrics & Gynecology

## 2015-05-26 ENCOUNTER — Ambulatory Visit (HOSPITAL_COMMUNITY)
Admission: RE | Admit: 2015-05-26 | Discharge: 2015-05-26 | Disposition: A | Discharge status: Home / Self Care | Payer: BLUE CROSS/BLUE SHIELD | Source: Ambulatory Visit | Attending: Obstetrics & Gynecology | Admitting: Obstetrics & Gynecology

## 2015-05-26 DIAGNOSIS — E669 Obesity, unspecified: Secondary | ICD-10-CM | POA: Diagnosis not present

## 2015-05-26 DIAGNOSIS — Z3A20 20 weeks gestation of pregnancy: Secondary | ICD-10-CM

## 2015-05-26 DIAGNOSIS — Z3689 Encounter for other specified antenatal screening: Secondary | ICD-10-CM

## 2015-05-26 DIAGNOSIS — Z3492 Encounter for supervision of normal pregnancy, unspecified, second trimester: Secondary | ICD-10-CM

## 2015-05-26 DIAGNOSIS — O99212 Obesity complicating pregnancy, second trimester: Secondary | ICD-10-CM

## 2015-05-30 ENCOUNTER — Encounter: Payer: Self-pay | Admitting: Obstetrics & Gynecology

## 2015-05-30 ENCOUNTER — Ambulatory Visit (INDEPENDENT_AMBULATORY_CARE_PROVIDER_SITE_OTHER): Payer: BLUE CROSS/BLUE SHIELD | Admitting: Obstetrics & Gynecology

## 2015-05-30 VITALS — BP 115/70 | Wt 227.0 lb

## 2015-05-30 DIAGNOSIS — Z3402 Encounter for supervision of normal first pregnancy, second trimester: Secondary | ICD-10-CM

## 2015-05-30 DIAGNOSIS — O99210 Obesity complicating pregnancy, unspecified trimester: Secondary | ICD-10-CM

## 2015-05-30 DIAGNOSIS — O2342 Unspecified infection of urinary tract in pregnancy, second trimester: Secondary | ICD-10-CM

## 2015-05-30 DIAGNOSIS — Z3492 Encounter for supervision of normal pregnancy, unspecified, second trimester: Secondary | ICD-10-CM

## 2015-05-30 DIAGNOSIS — B951 Streptococcus, group B, as the cause of diseases classified elsewhere: Secondary | ICD-10-CM

## 2015-05-30 NOTE — Progress Notes (Signed)
Subjective:  Julia Acosta is a 30 y.o. G1P0 at 5137w4d being seen today for ongoing prenatal care.  She is currently monitored for the following issues for this low-risk pregnancy and has Supervision of normal pregnancy; GBS (group B streptococcus) UTI complicating pregnancy; and Obesity in pregnancy on her problem list.  Patient reports rare fleeting lower pelvic cramping.  Contractions: Irritability. Vag. Bleeding: None.  Movement: Present. Denies leaking of fluid.   The following portions of the patient's history were reviewed and updated as appropriate: allergies, current medications, past family history, past medical history, past social history, past surgical history and problem list. Problem list updated.  Objective:   Filed Vitals:   05/30/15 1009  BP: 115/70  Weight: 227 lb (102.967 kg)    Fetal Status: Fetal Heart Rate (bpm): 164 Fundal Height: 23 cm Movement: Present     General:  Alert, oriented and cooperative. Patient is in no acute distress.  Skin: Skin is warm and dry. No rash noted.   Cardiovascular: Normal heart rate noted  Respiratory: Normal respiratory effort, no problems with respiration noted  Abdomen: Soft, gravid, appropriate for gestational age. Pain/Pressure: Absent     Pelvic: Vag. Bleeding: None Vag D/C Character: Thin   Cervical exam deferred        Extremities: Normal range of motion.  Edema: None  Mental Status: Normal mood and affect. Normal behavior. Normal judgment and thought content.   Urinalysis: Urine Protein: Trace Urine Glucose: Trace  Assessment and Plan:  Pregnancy: G1P0 at 3737w4d  1. Supervision of normal pregnancy, second trimester  2. Obesity in pregnancy  weight gain appropriate to date  3. GBS (group B streptococcus) UTI complicating pregnancy, second trimester D/w pt treatment of GBS in labor  Preterm labor symptoms and general obstetric precautions including but not limited to vaginal bleeding, contractions, leaking of fluid and  fetal movement were reviewed in detail with the patient. Please refer to After Visit Summary for other counseling recommendations.  Return in about 4 weeks (around 06/27/2015).   Willodean Rosenthalarolyn Harraway-Smith, MD

## 2015-05-30 NOTE — Patient Instructions (Signed)
Group B streptococcus (GBS) is a type of bacteria often found in healthy women. GBS is not the same as the bacteria that causes strep throat. You may have GBS in your vagina, rectum, or bladder. GBS does not spread through sexual contact, but it can be passed to a baby during childbirth. This can be dangerous for your baby. It is not dangerous to you and usually does not cause any symptoms. Your health care provider may test you for GBS when your pregnancy is between 35 and 37 weeks. GBS is dangerous only during birth, so there is no need to test for it earlier. It is possible to have GBS during pregnancy and never pass it to your baby. If your test results are positive for GBS, your health care provider may recommend giving you antibiotic medicine during delivery to make sure your baby stays healthy. RISK FACTORS You are more likely to pass GBS to your baby if:   Your water breaks (ruptured membrane) or you go into labor before 37 weeks.  Your water breaks 18 hours before you deliver.  You passed GBS during a previous pregnancy.  You have a urinary tract infection caused by GBS any time during pregnancy.  You have a fever during labor. SYMPTOMS Most women who have GBS do not have any symptoms. If you have a urinary tract infection caused by GBS, you might have frequent or painful urination and fever. Babies who get GBS usually show symptoms within 7 days of birth. Symptoms may include:   Breathing problems.  Heart and blood pressure problems.  Digestive and kidney problems. DIAGNOSIS Routine screening for GBS is recommended for all pregnant women. A health care provider takes a sample of the fluid in your vagina and rectum with a swab. It is then sent to a lab to be checked for GBS. A sample of your urine may also be checked for the bacteria.  TREATMENT If you test positive for GBS, you may need treatment with an antibiotic medicine during labor. As soon as you go into labor, or as soon as  your membranes rupture, you will get the antibiotic medicine through an IV access. You will continue to get the medicine until after you give birth. You do not need antibiotic medicine if you are having a cesarean delivery.If your baby shows signs or symptoms of GBS after birth, your baby can also be treated with an antibiotic medicine. HOME CARE INSTRUCTIONS   Take all antibiotic medicine as prescribed by your health care provider. Only take medicine as directed.   Continue with prenatal visits and care.   Keep all follow-up appointments.  SEEK MEDICAL CARE IF:   You have pain when you urinate.   You have to urinate frequently.   You have a fever.  SEEK IMMEDIATE MEDICAL CARE IF:   Your membranes rupture.  You go into labor.   This information is not intended to replace advice given to you by your health care provider. Make sure you discuss any questions you have with your health care provider.   Document Released: 08/07/2007 Document Revised: 05/05/2013 Document Reviewed: 02/20/2013 Elsevier Interactive Patient Education 2016 [employer]  

## 2015-06-27 ENCOUNTER — Encounter: Payer: BLUE CROSS/BLUE SHIELD | Admitting: Obstetrics & Gynecology

## 2015-06-30 ENCOUNTER — Encounter: Payer: Self-pay | Admitting: Obstetrics & Gynecology

## 2015-06-30 ENCOUNTER — Ambulatory Visit (INDEPENDENT_AMBULATORY_CARE_PROVIDER_SITE_OTHER): Payer: BLUE CROSS/BLUE SHIELD | Admitting: Obstetrics & Gynecology

## 2015-06-30 VITALS — BP 120/83 | HR 93 | Wt 225.0 lb

## 2015-06-30 DIAGNOSIS — Z36 Encounter for antenatal screening of mother: Secondary | ICD-10-CM

## 2015-06-30 DIAGNOSIS — O2342 Unspecified infection of urinary tract in pregnancy, second trimester: Secondary | ICD-10-CM

## 2015-06-30 DIAGNOSIS — Z3402 Encounter for supervision of normal first pregnancy, second trimester: Secondary | ICD-10-CM

## 2015-06-30 DIAGNOSIS — Z3492 Encounter for supervision of normal pregnancy, unspecified, second trimester: Secondary | ICD-10-CM

## 2015-06-30 DIAGNOSIS — O99210 Obesity complicating pregnancy, unspecified trimester: Secondary | ICD-10-CM

## 2015-06-30 DIAGNOSIS — B951 Streptococcus, group B, as the cause of diseases classified elsewhere: Secondary | ICD-10-CM

## 2015-06-30 LAB — CBC
HEMATOCRIT: 31.9 % — AB (ref 36.0–46.0)
Hemoglobin: 10.4 g/dL — ABNORMAL LOW (ref 12.0–15.0)
MCH: 27 pg (ref 26.0–34.0)
MCHC: 32.6 g/dL (ref 30.0–36.0)
MCV: 82.9 fL (ref 78.0–100.0)
MPV: 9.2 fL (ref 8.6–12.4)
Platelets: 292 10*3/uL (ref 150–400)
RBC: 3.85 MIL/uL — ABNORMAL LOW (ref 3.87–5.11)
RDW: 15.1 % (ref 11.5–15.5)
WBC: 8.9 10*3/uL (ref 4.0–10.5)

## 2015-06-30 NOTE — Progress Notes (Signed)
Subjective:  Julia Acosta is a 30 y.o. G1P0 at [redacted]w[redacted]d being seen today for ongoing prenatal care.  She is currently monitored for the following issues for this high-risk pregnancy and has Supervision of normal pregnancy; GBS (group B streptococcus) UTI complicating pregnancy; and Obesity in pregnancy on her problem list.  Pt has been walking per day.  Patient reports right hip pain at night only..  Contractions: Not present. Vag. Bleeding: None.  Movement: Present. Denies leaking of fluid.   The following portions of the patient's history were reviewed and updated as appropriate: allergies, current medications, past family history, past medical history, past social history, past surgical history and problem list. Problem list updated.  Objective:   Filed Vitals:   06/30/15 0950  BP: 120/83  Pulse: 93  Weight: 225 lb (102.059 kg)    Fetal Status: Fetal Heart Rate (bpm): 156 Fundal Height: 29 cm Movement: Present     General:  Alert, oriented and cooperative. Patient is in no acute distress.  Skin: Skin is warm and dry. No rash noted.   Cardiovascular: Normal heart rate noted  Respiratory: Normal respiratory effort, no problems with respiration noted  Abdomen: Soft, gravid, appropriate for gestational age. Pain/Pressure: Absent     Pelvic: Vag. Bleeding: None Vag D/C Character: Thin   Cervical exam deferred        Extremities: Normal range of motion.  Edema: None  Mental Status: Normal mood and affect. Normal behavior. Normal judgment and thought content.   Urinalysis: Urine Protein: Trace Urine Glucose: Negative  Assessment and Plan:  Pregnancy: G1P0 at [redacted]w[redacted]d  1. Supervision of normal pregnancy, second trimester  - Glucose Tolerance, 1 HR (50g) - RPR - HIV antibody (with reflex) - CBC  2. Obesity in pregnancy rec increased exercise.  Walk longer than per day.  Increase gradually   3. GBS (group B streptococcus) UTI complicating pregnancy, second trimester Treat  in labor  Preterm labor symptoms and general obstetric precautions including but not limited to vaginal bleeding, contractions, leaking of fluid and fetal movement were reviewed in detail with the patient. Please refer to After Visit Summary for other counseling recommendations.  Return in about 4 weeks (around 07/28/2015).   Willodean Rosenthal, MD

## 2015-06-30 NOTE — Patient Instructions (Signed)

## 2015-07-01 LAB — GLUCOSE TOLERANCE, 1 HOUR (50G) W/O FASTING: GLUCOSE 1 HOUR GTT: 127 mg/dL (ref 70–140)

## 2015-07-01 LAB — HIV ANTIBODY (ROUTINE TESTING W REFLEX): HIV 1&2 Ab, 4th Generation: NONREACTIVE

## 2015-07-01 LAB — RPR

## 2015-07-04 ENCOUNTER — Telehealth: Payer: Self-pay

## 2015-07-04 NOTE — Telephone Encounter (Signed)
-----   Message from Willodean Rosenthal, MD sent at 07/04/2015  9:08 AM EST ----- Please call pt. She is anemic.  She should begin FeSO4 po bid.  This is OTC.  Her Glucola was normal.   Thx, clh-S

## 2015-07-04 NOTE — Telephone Encounter (Signed)
Patient called and made aware that her glucose was normal but her iron (hemaglobin) was low. We would like her to take an over the counter iron pill daily. Patient states understanding. Also encourage to eat dark green leafy veggies and increase iron rich foods. Armandina Stammer RN BSN

## 2015-07-28 ENCOUNTER — Encounter: Payer: Self-pay | Admitting: Obstetrics & Gynecology

## 2015-07-28 ENCOUNTER — Ambulatory Visit (INDEPENDENT_AMBULATORY_CARE_PROVIDER_SITE_OTHER): Payer: BLUE CROSS/BLUE SHIELD | Admitting: Obstetrics & Gynecology

## 2015-07-28 VITALS — BP 122/89 | HR 108 | Wt 229.0 lb

## 2015-07-28 DIAGNOSIS — Z3493 Encounter for supervision of normal pregnancy, unspecified, third trimester: Secondary | ICD-10-CM

## 2015-07-28 DIAGNOSIS — O26893 Other specified pregnancy related conditions, third trimester: Secondary | ICD-10-CM

## 2015-07-28 DIAGNOSIS — O2343 Unspecified infection of urinary tract in pregnancy, third trimester: Secondary | ICD-10-CM

## 2015-07-28 DIAGNOSIS — Z3403 Encounter for supervision of normal first pregnancy, third trimester: Secondary | ICD-10-CM

## 2015-07-28 DIAGNOSIS — B951 Streptococcus, group B, as the cause of diseases classified elsewhere: Secondary | ICD-10-CM

## 2015-07-28 DIAGNOSIS — O99210 Obesity complicating pregnancy, unspecified trimester: Secondary | ICD-10-CM

## 2015-07-28 DIAGNOSIS — R12 Heartburn: Secondary | ICD-10-CM

## 2015-07-28 MED ORDER — RANITIDINE HCL 150 MG PO TABS: 150.0000 mg | ORAL_TABLET | Freq: Two times a day (BID) | ORAL | Status: DC

## 2015-07-28 NOTE — Patient Instructions (Signed)
Heartburn During Pregnancy Heartburn is a burning sensation in the chest caused by stomach acid backing up into the esophagus. Heartburn is common in pregnancy because a certain hormone (progesterone) is released when a woman is pregnant. The progesterone hormone may relax the valve that separates the esophagus from the stomach. This allows acid to go up into the esophagus, causing heartburn. Heartburn may also happen in pregnancy because the enlarging uterus pushes up on the stomach, which pushes more acid into the esophagus. This is especially true in the later stages of pregnancy. Heartburn problems usually go away after giving birth. CAUSES  Heartburn is caused by stomach acid backing up into the esophagus. During pregnancy, this may result from various things, including:   The progesterone hormone.  Changing hormone levels.  The growing uterus pushing stomach acid upward.  Large meals.  Certain foods and drinks.  Exercise.  Increased acid production. SIGNS AND SYMPTOMS   Burning pain in the chest or lower throat.  Bitter taste in the mouth.  Coughing. DIAGNOSIS  Your health care provider will typically diagnose heartburn by taking a careful history of your concern. Blood tests may be done to check for a certain type of bacteria that is associated with heartburn. Sometimes, heartburn is diagnosed by prescribing a heartburn medicine to see if the symptoms improve. In some cases, a procedure called an endoscopy may be done. In this procedure, a tube with a light and a camera on the end (endoscope) is used to examine the esophagus and the stomach. TREATMENT  Treatment will vary depending on the severity of your symptoms. Your health care provider may recommend:  Over-the-counter medicines (antacids, acid reducers) for mild heartburn.  Prescription medicines to decrease stomach acid or to protect your stomach lining.  Certain changes in your diet.  Elevating the head of your bed  by putting blocks under the legs. This helps prevent stomach acid from backing up into the esophagus when you are lying down. HOME CARE INSTRUCTIONS   Only take over-the-counter or prescription medicines as directed by your health care provider.  Raise the head of your bed by putting blocks under the legs if instructed to do so by your health care provider. Sleeping with more pillows is not effective because it only changes the position of your head.  Do not exercise right after eating.  Avoid eating 2-3 hours before bed. Do not lie down right after eating.  Eat small meals throughout the day instead of three large meals.  Identify foods and beverages that make your symptoms worse and avoid them. Foods you may want to avoid include:  Peppers.  Chocolate.  High-fat foods, including fried foods.  Spicy foods.  Garlic and onions.  Citrus fruits, including oranges, grapefruit, lemons, and limes.  Food containing tomatoes or tomato products.  Mint.  Carbonated and caffeinated drinks.  Vinegar. SEEK MEDICAL CARE IF:  You have abdominal pain of any kind.  You feel burning in your upper abdomen or chest, especially after eating or lying down.  You have nausea and vomiting.  Your stomach feels upset after you eat. SEEK IMMEDIATE MEDICAL CARE IF:   You have severe chest pain that goes down your arm or into your jaw or neck.  You feel sweaty, dizzy, or light-headed.  You become short of breath.  You vomit blood.  You have difficulty or pain with swallowing.  You have bloody or black, tarry stools.  You have episodes of heartburn more than 3 times a week,   for more than 2 weeks. MAKE SURE YOU:  Understand these instructions.  Will watch your condition.  Will get help right away if you are not doing well or get worse.   This information is not intended to replace advice given to you by your health care provider. Make sure you discuss any questions you have with  your health care provider.   Document Released: 04/27/2000 Document Revised: 05/21/2014 Document Reviewed: 12/17/2012 Elsevier Interactive Patient Education 2016 [employer]  

## 2015-07-28 NOTE — Progress Notes (Signed)
Subjective:  Julia Acosta is a 30 y.o. G1P0 at 5354w0d being seen today for ongoing prenatal care.  She is currently monitored for the following issues for this low-risk pregnancy and has Supervision of normal pregnancy; GBS (group B streptococcus) UTI complicating pregnancy; and Obesity in pregnancy on her problem list.  Patient reports heartburn.  Contractions: Not present. Vag. Bleeding: None.  Movement: Present. Denies leaking of fluid.   The following portions of the patient's history were reviewed and updated as appropriate: allergies, current medications, past family history, past medical history, past social history, past surgical history and problem list. Problem list updated.  Objective:   Filed Vitals:   07/28/15 0938  BP: 122/89  Pulse: 108  Weight: 229 lb (103.874 kg)    Fetal Status: Fetal Heart Rate (bpm): 158   Movement: Present     General:  Alert, oriented and cooperative. Patient is in no acute distress.  Skin: Skin is warm and dry. No rash noted.   Cardiovascular: Normal heart rate noted  Respiratory: Normal respiratory effort, no problems with respiration noted  Abdomen: Soft, gravid, appropriate for gestational age. Pain/Pressure: Absent     Pelvic: Vag. Bleeding: None Vag D/C Character: Thin   Cervical exam deferred        Extremities: Normal range of motion.  Edema: None  Mental Status: Normal mood and affect. Normal behavior. Normal judgment and thought content.   Urinalysis: Urine Protein: Trace Urine Glucose: Negative  Assessment and Plan:  Pregnancy: G1P0 at 6954w0d  1. Supervision of normal pregnancy, third trimester Pt declines childbirth classes   2. Obesity in pregnancy Weight gain appropriate  3. GBS (group B streptococcus) UTI complicating pregnancy, third trimester needs prophylaxis in labor  4. Heartburn during pregnancy, third trimester  - ranitidine (ZANTAC) 150 MG tablet; Take 1 tablet (150 mg total) by mouth 2 (two) times daily.   Dispense: 60 tablet; Refill: 1  Preterm labor symptoms and general obstetric precautions including but not limited to vaginal bleeding, contractions, leaking of fluid and fetal movement were reviewed in detail with the patient. Please refer to After Visit Summary for other counseling recommendations.  Return in about 2 weeks (around 08/11/2015).   Willodean Rosenthalarolyn Harraway-Smith, MD

## 2015-08-11 ENCOUNTER — Ambulatory Visit (INDEPENDENT_AMBULATORY_CARE_PROVIDER_SITE_OTHER): Payer: BLUE CROSS/BLUE SHIELD | Admitting: Obstetrics & Gynecology

## 2015-08-11 VITALS — BP 110/85 | HR 100 | Wt 230.0 lb

## 2015-08-11 DIAGNOSIS — Z23 Encounter for immunization: Secondary | ICD-10-CM

## 2015-08-11 DIAGNOSIS — Z3403 Encounter for supervision of normal first pregnancy, third trimester: Secondary | ICD-10-CM

## 2015-08-11 DIAGNOSIS — O99210 Obesity complicating pregnancy, unspecified trimester: Secondary | ICD-10-CM

## 2015-08-11 DIAGNOSIS — Z3493 Encounter for supervision of normal pregnancy, unspecified, third trimester: Secondary | ICD-10-CM

## 2015-08-11 NOTE — Progress Notes (Signed)
Subjective:  Julia Acosta is a 30 y.o. MA  G1P0 (son) at 9023w0d being seen today for ongoing prenatal care.  She is currently monitored for the following issues for this low-risk pregnancy and has Supervision of normal pregnancy; GBS (group B streptococcus) UTI complicating pregnancy; and Obesity in pregnancy on her problem list.  Patient reports no complaints.  Contractions: Not present. Vag. Bleeding: None.  Movement: Present. Denies leaking of fluid.   The following portions of the patient's history were reviewed and updated as appropriate: allergies, current medications, past family history, past medical history, past social history, past surgical history and problem list. Problem list updated.  Objective:   Filed Vitals:   08/11/15 0957 08/11/15 1021  BP: 128/93 110/85  Pulse: 115 100  Weight: 230 lb (104.327 kg)     Fetal Status: Fetal Heart Rate (bpm): 158   Movement: Present     General:  Alert, oriented and cooperative. Patient is in no acute distress.  Skin: Skin is warm and dry. No rash noted.   Cardiovascular: Normal heart rate noted  Respiratory: Normal respiratory effort, no problems with respiration noted  Abdomen: Soft, gravid, appropriate for gestational age. Pain/Pressure: Absent     Pelvic: Vag. Bleeding: None Vag D/C Character: Thin   Cervical exam deferred        Extremities: Normal range of motion.  Edema: None  Mental Status: Normal mood and affect. Normal behavior. Normal judgment and thought content.   Urinalysis: Urine Protein: Trace Urine Glucose: Negative  Assessment and Plan:  Pregnancy: G1P0 at 1823w0d  1. Supervision of normal pregnancy, third trimester -TDAP today  2. Obesity in pregnancy - She is doing well with her weight gain  Preterm labor symptoms and general obstetric precautions including but not limited to vaginal bleeding, contractions, leaking of fluid and fetal movement were reviewed in detail with the patient. Please refer to After  Visit Summary for other counseling recommendations.  Return in about 2 weeks (around 08/25/2015).   Allie BossierMyra C Talbert Trembath, MD

## 2015-08-29 ENCOUNTER — Ambulatory Visit (INDEPENDENT_AMBULATORY_CARE_PROVIDER_SITE_OTHER): Payer: BLUE CROSS/BLUE SHIELD | Admitting: Obstetrics & Gynecology

## 2015-08-29 VITALS — BP 112/86 | HR 102 | Wt 231.0 lb

## 2015-08-29 DIAGNOSIS — O2343 Unspecified infection of urinary tract in pregnancy, third trimester: Secondary | ICD-10-CM

## 2015-08-29 DIAGNOSIS — Z3493 Encounter for supervision of normal pregnancy, unspecified, third trimester: Secondary | ICD-10-CM

## 2015-08-29 DIAGNOSIS — Z3403 Encounter for supervision of normal first pregnancy, third trimester: Secondary | ICD-10-CM

## 2015-08-29 DIAGNOSIS — O99210 Obesity complicating pregnancy, unspecified trimester: Secondary | ICD-10-CM

## 2015-08-29 DIAGNOSIS — B951 Streptococcus, group B, as the cause of diseases classified elsewhere: Secondary | ICD-10-CM

## 2015-08-29 NOTE — Progress Notes (Signed)
Subjective:  Julia Acosta is a 30 y.o. G1P0 at 1573w4d being seen today for ongoing prenatal care.  She is currently monitored for the following issues for this low-risk pregnancy and has Supervision of normal pregnancy; GBS (group B streptococcus) UTI complicating pregnancy; and Obesity in pregnancy on her problem list.  Patient reports no complaints.  Contractions: Not present. Vag. Bleeding: None.  Movement: Present. Denies leaking of fluid.   The following portions of the patient's history were reviewed and updated as appropriate: allergies, current medications, past family history, past medical history, past social history, past surgical history and problem list. Problem list updated.  Objective:   Filed Vitals:   08/29/15 0933  BP: 112/86  Pulse: 102  Weight: 231 lb (104.781 kg)    Fetal Status: Fetal Heart Rate (bpm): 155   Movement: Present     General:  Alert, oriented and cooperative. Patient is in no acute distress.  Skin: Skin is warm and dry. No rash noted.   Cardiovascular: Normal heart rate noted  Respiratory: Normal respiratory effort, no problems with respiration noted  Abdomen: Soft, gravid, appropriate for gestational age. Pain/Pressure: Absent     Pelvic: Vag. Bleeding: None Vag D/C Character: Thin   Cervical exam deferred        Extremities: Normal range of motion.  Edema: None  Mental Status: Normal mood and affect. Normal behavior. Normal judgment and thought content.   Urinalysis: Urine Protein: Trace Urine Glucose: Negative  Assessment and Plan:  Pregnancy: G1P0 at 2973w4d  1. GBS (group B streptococcus) UTI complicating pregnancy, third trimester   2. Obesity in pregnancy   3. Supervision of normal pregnancy, third trimester   Preterm labor symptoms and general obstetric precautions including but not limited to vaginal bleeding, contractions, leaking of fluid and fetal movement were reviewed in detail with the patient. Please refer to After Visit  Summary for other counseling recommendations.  Return in about 2 weeks (around 09/12/2015).   Allie BossierMyra C Jasaun Carn, MD

## 2015-09-12 ENCOUNTER — Ambulatory Visit (INDEPENDENT_AMBULATORY_CARE_PROVIDER_SITE_OTHER): Payer: BLUE CROSS/BLUE SHIELD | Admitting: Obstetrics & Gynecology

## 2015-09-12 ENCOUNTER — Encounter: Payer: Self-pay | Admitting: Obstetrics & Gynecology

## 2015-09-12 VITALS — BP 126/83 | HR 94 | Wt 234.0 lb

## 2015-09-12 DIAGNOSIS — B951 Streptococcus, group B, as the cause of diseases classified elsewhere: Secondary | ICD-10-CM

## 2015-09-12 DIAGNOSIS — Z3493 Encounter for supervision of normal pregnancy, unspecified, third trimester: Secondary | ICD-10-CM

## 2015-09-12 DIAGNOSIS — O99210 Obesity complicating pregnancy, unspecified trimester: Secondary | ICD-10-CM

## 2015-09-12 DIAGNOSIS — Z3403 Encounter for supervision of normal first pregnancy, third trimester: Secondary | ICD-10-CM

## 2015-09-12 DIAGNOSIS — O2343 Unspecified infection of urinary tract in pregnancy, third trimester: Secondary | ICD-10-CM

## 2015-09-12 DIAGNOSIS — Z113 Encounter for screening for infections with a predominantly sexual mode of transmission: Secondary | ICD-10-CM | POA: Diagnosis not present

## 2015-09-12 NOTE — Progress Notes (Signed)
Subjective:  Julia Acosta is a 30 y.o. G1P0 at 30103w4d being seen today for ongoing prenatal care.  She is currently monitored for the following issues for this low-risk pregnancy and has Supervision of normal pregnancy; GBS (group B streptococcus) UTI complicating pregnancy; and Obesity in pregnancy on her problem list.  Patient reports no complaints.  Contractions: Not present. Vag. Bleeding: None.  Movement: Present. Denies leaking of fluid.   The following portions of the patient's history were reviewed and updated as appropriate: allergies, current medications, past family history, past medical history, past social history, past surgical history and problem list. Problem list updated.  Objective:   Filed Vitals:   09/12/15 0933  BP: 126/83  Pulse: 94  Weight: 234 lb (106.142 kg)    Fetal Status: Fetal Heart Rate (bpm): 140   Movement: Present     General:  Alert, oriented and cooperative. Patient is in no acute distress.  Skin: Skin is warm and dry. No rash noted.   Cardiovascular: Normal heart rate noted  Respiratory: Normal respiratory effort, no problems with respiration noted  Abdomen: Soft, gravid, appropriate for gestational age. Pain/Pressure: Absent     Pelvic: Vag. Bleeding: None Vag D/C Character: Thin   Cervical exam performed        Extremities: Normal range of motion.  Edema: None  Mental Status: Normal mood and affect. Normal behavior. Normal judgment and thought content.   Urinalysis:      Assessment and Plan:  Pregnancy: G1P0 at 61103w4d  1. Supervision of normal pregnancy, third trimester For EFW and f/u of fetal heart  - US MFM OB LIMITED; Future - [facility]lam[facility]  2. Obesity in pregnancy  - US MFM OB LIMITED; Future  3. GBS (group B streptococcus) UTI complicating pregnancy, third trimester needs atbx in labor   Preterm labor symptoms and general obstetric precautions including but not limited to vaginal bleeding,  contractions, leaking of fluid and fetal movement were reviewed in detail with the patient. Please refer to After Visit Summary for other counseling recommendations.  Return in about 2 weeks (around 09/26/2015).   Willodean Rosenthalarolyn Harraway-Smith, MD

## 2015-09-12 NOTE — Patient Instructions (Signed)

## 2015-09-13 LAB — GC/CHLAMYDIA PROBE AMP (~~LOC~~) NOT AT ARMC
Chlamydia: NEGATIVE
Neisseria Gonorrhea: NEGATIVE

## 2015-09-19 ENCOUNTER — Other Ambulatory Visit: Payer: Self-pay | Admitting: Obstetrics & Gynecology

## 2015-09-19 ENCOUNTER — Ambulatory Visit (HOSPITAL_COMMUNITY)
Admission: RE | Admit: 2015-09-19 | Discharge: 2015-09-19 | Disposition: A | Discharge status: Home / Self Care | Payer: BLUE CROSS/BLUE SHIELD | Source: Ambulatory Visit | Attending: Obstetrics & Gynecology | Admitting: Obstetrics & Gynecology

## 2015-09-19 DIAGNOSIS — Z3A36 36 weeks gestation of pregnancy: Secondary | ICD-10-CM

## 2015-09-19 DIAGNOSIS — IMO0002 Reserved for concepts with insufficient information to code with codable children: Secondary | ICD-10-CM

## 2015-09-19 DIAGNOSIS — O99210 Obesity complicating pregnancy, unspecified trimester: Secondary | ICD-10-CM

## 2015-09-19 DIAGNOSIS — Z36 Encounter for antenatal screening of mother: Secondary | ICD-10-CM | POA: Diagnosis present

## 2015-09-19 DIAGNOSIS — Z0489 Encounter for examination and observation for other specified reasons: Secondary | ICD-10-CM

## 2015-09-19 DIAGNOSIS — Z3493 Encounter for supervision of normal pregnancy, unspecified, third trimester: Secondary | ICD-10-CM

## 2015-09-26 ENCOUNTER — Ambulatory Visit (INDEPENDENT_AMBULATORY_CARE_PROVIDER_SITE_OTHER): Payer: BLUE CROSS/BLUE SHIELD | Admitting: Obstetrics & Gynecology

## 2015-09-26 VITALS — BP 121/89 | HR 96 | Wt 237.0 lb

## 2015-09-26 DIAGNOSIS — Z3403 Encounter for supervision of normal first pregnancy, third trimester: Secondary | ICD-10-CM

## 2015-09-26 DIAGNOSIS — Z3493 Encounter for supervision of normal pregnancy, unspecified, third trimester: Secondary | ICD-10-CM

## 2015-09-26 NOTE — Patient Instructions (Signed)
Return to clinic for any scheduled appointments or obstetric concerns, or go to [facility] for evaluation  

## 2015-09-26 NOTE — Progress Notes (Signed)
Subjective:  Julia Acosta is a 30 y.o. G1P0 at 969w4d being seen today for ongoing prenatal care.  She is currently monitored for the following issues for this low-risk pregnancy and has Supervision of normal pregnancy; GBS (group B streptococcus) UTI complicating pregnancy; and Obesity in pregnancy on her problem list.  Patient reports no complaints.  Contractions: Not present. Vag. Bleeding: None.  Movement: Present. Denies leaking of fluid.  Accompanied by husband.  The following portions of the patient's history were reviewed and updated as appropriate: allergies, current medications, past family history, past medical history, past social history, past surgical history and problem list. Problem list updated.  Objective:   Filed Vitals:   09/26/15 0945  BP: 121/89  Pulse: 96  Weight: 237 lb (107.502 kg)    Fetal Status: Fetal Heart Rate (bpm): 144 Fundal Height: 38 cm Movement: Present     General:  Alert, oriented and cooperative. Patient is in no acute distress.  Skin: Skin is warm and dry. No rash noted.   Cardiovascular: Normal heart rate noted  Respiratory: Normal respiratory effort, no problems with respiration noted  Abdomen: Soft, gravid, appropriate for gestational age. Pain/Pressure: Absent     Pelvic: Vag. Bleeding: None Vag D/C Character: Thin  Cervical exam deferred        Extremities: Normal range of motion.  Edema: Trace  Mental Status: Normal mood and affect. Normal behavior. Normal judgment and thought content.   Urinalysis: Urine Protein: Negative Urine Glucose: Negative 09/19/15 7757w0d EFW 8lb (3635g/>90%), AC >97%, AFI 11.63 cm, cephalic  Assessment and Plan:  Pregnancy: G1P0 at 2969w4d  1. Supervision of normal pregnancy, third trimester Term labor symptoms and general obstetric precautions including but not limited to vaginal bleeding, contractions, leaking of fluid and fetal movement were reviewed in detail with the patient. Please refer to After Visit Summary  for other counseling recommendations.  Return in about 1 week (around 10/03/2015) for OB Visit.   Tereso NewcomerUgonna A Ndea Kilroy, MD

## 2015-10-03 ENCOUNTER — Ambulatory Visit (INDEPENDENT_AMBULATORY_CARE_PROVIDER_SITE_OTHER): Payer: BLUE CROSS/BLUE SHIELD | Admitting: Obstetrics & Gynecology

## 2015-10-03 ENCOUNTER — Encounter: Payer: Self-pay | Admitting: Obstetrics & Gynecology

## 2015-10-03 VITALS — BP 128/79 | HR 79 | Wt 238.0 lb

## 2015-10-03 DIAGNOSIS — Z3403 Encounter for supervision of normal first pregnancy, third trimester: Secondary | ICD-10-CM

## 2015-10-03 DIAGNOSIS — O99210 Obesity complicating pregnancy, unspecified trimester: Secondary | ICD-10-CM

## 2015-10-03 DIAGNOSIS — B951 Streptococcus, group B, as the cause of diseases classified elsewhere: Secondary | ICD-10-CM

## 2015-10-03 DIAGNOSIS — O2343 Unspecified infection of urinary tract in pregnancy, third trimester: Secondary | ICD-10-CM

## 2015-10-03 DIAGNOSIS — Z3493 Encounter for supervision of normal pregnancy, unspecified, third trimester: Secondary | ICD-10-CM

## 2015-10-03 NOTE — Progress Notes (Signed)
Subjective:  Julia Acosta is a 30 y.o. G1P0 at 131w4d being seen today for ongoing prenatal care.  She is currently monitored for the following issues for this low-risk pregnancy and has Supervision of normal pregnancy; GBS (group B streptococcus) UTI complicating pregnancy; and Obesity in pregnancy on her problem list.  Patient reports no complaints.  Contractions: Irregular. Vag. Bleeding: None.  Movement: Present. Denies leaking of fluid.   The following portions of the patient's history were reviewed and updated as appropriate: allergies, current medications, past family history, past medical history, past social history, past surgical history and problem list. Problem list updated.  Objective:   Filed Vitals:   10/03/15 0911  BP: 128/79  Pulse: 79  Weight: 238 lb (107.956 kg)    Fetal Status: Fetal Heart Rate (bpm): 140   Movement: Present  Presentation: Vertex  General:  Alert, oriented and cooperative. Patient is in no acute distress.  Skin: Skin is warm and dry. No rash noted.   Cardiovascular: Normal heart rate noted  Respiratory: Normal respiratory effort, no problems with respiration noted  Abdomen: Soft, gravid, appropriate for gestational age. Pain/Pressure: Absent     Pelvic: Vag. Bleeding: None Vag D/C Character: Thin   Cervical exam performed Dilation: 1.5 Effacement (%): 60 Station: Ballotable  Extremities: Normal range of motion.  Edema: Trace  Mental Status: Normal mood and affect. Normal behavior. Normal judgment and thought content.   Urinalysis: Urine Protein: Trace Urine Glucose: Negative  Assessment and Plan:  Pregnancy: G1P0 at 8231w4d  1. Supervision of normal pregnancy, third trimester Reviewed labor/ROM s/sx  2. Obesity in pregnancy  3. GBS (group B streptococcus) UTI complicating pregnancy, third trimester Reviewed with pt need for ATBX once ROM or labor.   Term labor symptoms and general obstetric precautions including but not limited to vaginal  bleeding, contractions, leaking of fluid and fetal movement were reviewed in detail with the patient. Please refer to After Visit Summary for other counseling recommendations.  Return in about 1 week (around 10/10/2015).   Willodean Rosenthalarolyn Harraway-Smith, MD

## 2015-10-03 NOTE — Patient Instructions (Signed)

## 2015-10-05 ENCOUNTER — Encounter (HOSPITAL_COMMUNITY): Payer: Self-pay

## 2015-10-05 ENCOUNTER — Inpatient Hospital Stay (HOSPITAL_COMMUNITY): Payer: BLUE CROSS/BLUE SHIELD | Admitting: Anesthesiology

## 2015-10-05 ENCOUNTER — Inpatient Hospital Stay (HOSPITAL_COMMUNITY)
Admission: AD | Admit: 2015-10-05 | Discharge: 2015-10-08 | DRG: 775 | Disposition: A | Discharge status: Home / Self Care | Payer: BLUE CROSS/BLUE SHIELD | Source: Ambulatory Visit | Attending: Obstetrics & Gynecology | Admitting: Obstetrics & Gynecology

## 2015-10-05 DIAGNOSIS — O99824 Streptococcus B carrier state complicating childbirth: Secondary | ICD-10-CM | POA: Diagnosis present

## 2015-10-05 DIAGNOSIS — Z3A39 39 weeks gestation of pregnancy: Secondary | ICD-10-CM | POA: Diagnosis not present

## 2015-10-05 DIAGNOSIS — O99214 Obesity complicating childbirth: Secondary | ICD-10-CM | POA: Diagnosis present

## 2015-10-05 DIAGNOSIS — Z6841 Body Mass Index (BMI) 40.0 and over, adult: Secondary | ICD-10-CM

## 2015-10-05 DIAGNOSIS — IMO0001 Reserved for inherently not codable concepts without codable children: Secondary | ICD-10-CM

## 2015-10-05 DIAGNOSIS — O99210 Obesity complicating pregnancy, unspecified trimester: Secondary | ICD-10-CM

## 2015-10-05 DIAGNOSIS — Z3493 Encounter for supervision of normal pregnancy, unspecified, third trimester: Secondary | ICD-10-CM

## 2015-10-05 DIAGNOSIS — O4202 Full-term premature rupture of membranes, onset of labor within 24 hours of rupture: Secondary | ICD-10-CM | POA: Diagnosis not present

## 2015-10-05 HISTORY — DX: Cyst of Bartholin's gland: N75.0

## 2015-10-05 HISTORY — DX: Urinary tract infection, site not specified: N39.0

## 2015-10-05 LAB — COMPREHENSIVE METABOLIC PANEL
ALT: 15 U/L (ref 14–54)
AST: 19 U/L (ref 15–41)
Albumin: 3.1 g/dL — ABNORMAL LOW (ref 3.5–5.0)
Alkaline Phosphatase: 164 U/L — ABNORMAL HIGH (ref 38–126)
Anion gap: 10 (ref 5–15)
BUN: 8 mg/dL (ref 6–20)
CHLORIDE: 107 mmol/L (ref 101–111)
CO2: 20 mmol/L — AB (ref 22–32)
CREATININE: 0.52 mg/dL (ref 0.44–1.00)
Calcium: 9 mg/dL (ref 8.9–10.3)
GFR calc Af Amer: >60 mL/min (ref >60)
GFR calc non Af Amer: >60 mL/min (ref >60)
GLUCOSE: 83 mg/dL (ref 65–99)
Potassium: 4.1 mmol/L (ref 3.5–5.1)
SODIUM: 137 mmol/L (ref 135–145)
Total Bilirubin: 0.5 mg/dL (ref 0.3–1.2)
Total Protein: 6.8 g/dL (ref 6.5–8.1)

## 2015-10-05 LAB — OB RESULTS CONSOLE GBS: GBS: POSITIVE

## 2015-10-05 LAB — TYPE AND SCREEN
ABO/RH(D): O POS
ANTIBODY SCREEN: NEGATIVE

## 2015-10-05 LAB — CBC
HEMATOCRIT: 36.9 % (ref 36.0–46.0)
HEMOGLOBIN: 12.2 g/dL (ref 12.0–15.0)
MCH: 27.4 pg (ref 26.0–34.0)
MCHC: 33.1 g/dL (ref 30.0–36.0)
MCV: 82.9 fL (ref 78.0–100.0)
Platelets: 314 10*3/uL (ref 150–400)
RBC: 4.45 MIL/uL (ref 3.87–5.11)
RDW: 16 % — ABNORMAL HIGH (ref 11.5–15.5)
WBC: 10.5 10*3/uL (ref 4.0–10.5)

## 2015-10-05 LAB — ABO/RH: ABO/RH(D): O POS

## 2015-10-05 LAB — PROTEIN / CREATININE RATIO, URINE
Creatinine, Urine: 120 mg/dL
Protein Creatinine Ratio: 0.08 mg/mg{Cre} (ref 0.00–0.15)
Total Protein, Urine: 10 mg/dL

## 2015-10-05 LAB — POCT FERN TEST: POCT FERN TEST: POSITIVE

## 2015-10-05 MED ORDER — OXYCODONE-ACETAMINOPHEN 5-325 MG PO TABS: 2.0000 | ORAL_TABLET | ORAL | Status: DC | PRN

## 2015-10-05 MED ORDER — FENTANYL 2.5 MCG/ML BUPIVACAINE 1/10 % EPIDURAL INFUSION (WH - ANES)
14.0000 mL/h | INTRAMUSCULAR | Status: DC | PRN
  Administered 2015-10-05 (×4): 14 mL/h via EPIDURAL
  Filled 2015-10-05 (×2): qty 125

## 2015-10-05 MED ORDER — CITRIC ACID-SODIUM CITRATE 334-500 MG/5ML PO SOLN: 30.0000 mL | ORAL | Status: DC | PRN

## 2015-10-05 MED ORDER — SODIUM BICARBONATE 8.4 % IV SOLN
INTRAVENOUS | Status: DC | PRN
  Administered 2015-10-05 (×6): 3 mL via EPIDURAL

## 2015-10-05 MED ORDER — LACTATED RINGERS IV SOLN
INTRAVENOUS | Status: DC
  Administered 2015-10-05 (×2): via INTRAVENOUS

## 2015-10-05 MED ORDER — PENICILLIN G POTASSIUM 5000000 UNITS IJ SOLR
2.5000 10*6.[IU] | INTRAMUSCULAR | Status: DC
  Administered 2015-10-05 (×3): 2.5 10*6.[IU] via INTRAVENOUS
  Filled 2015-10-05 (×7): qty 2.5

## 2015-10-05 MED ORDER — TERBUTALINE SULFATE 1 MG/ML IJ SOLN: 0.2500 mg | Freq: Once | INTRAMUSCULAR | Status: DC | PRN

## 2015-10-05 MED ORDER — PHENYLEPHRINE 40 MCG/ML (10ML) SYRINGE FOR IV PUSH (FOR BLOOD PRESSURE SUPPORT): 80.0000 ug | PREFILLED_SYRINGE | INTRAVENOUS | Status: DC | PRN

## 2015-10-05 MED ORDER — ONDANSETRON HCL 4 MG/2ML IJ SOLN: 4.0000 mg | Freq: Four times a day (QID) | INTRAMUSCULAR | Status: DC | PRN

## 2015-10-05 MED ORDER — LIDOCAINE HCL (PF) 1 % IJ SOLN
30.0000 mL | INTRAMUSCULAR | Status: DC | PRN
  Administered 2015-10-06: 30 mL via SUBCUTANEOUS
  Filled 2015-10-05: qty 30

## 2015-10-05 MED ORDER — LIDOCAINE HCL (PF) 1 % IJ SOLN
INTRAMUSCULAR | Status: DC | PRN
  Administered 2015-10-05: 9 mL via EPIDURAL
  Administered 2015-10-05 (×2): 7 mL via EPIDURAL

## 2015-10-05 MED ORDER — EPHEDRINE 5 MG/ML INJ: 10.0000 mg | INTRAVENOUS | Status: DC | PRN

## 2015-10-05 MED ORDER — LACTATED RINGERS IV SOLN
500.0000 mL | INTRAVENOUS | Status: DC | PRN
  Administered 2015-10-05: 500 mL via INTRAVENOUS

## 2015-10-05 MED ORDER — EPHEDRINE 5 MG/ML INJ
10.0000 mg | INTRAVENOUS | Status: DC | PRN
Start: 2015-10-05 — End: 2015-10-06

## 2015-10-05 MED ORDER — PHENYLEPHRINE 40 MCG/ML (10ML) SYRINGE FOR IV PUSH (FOR BLOOD PRESSURE SUPPORT)
PREFILLED_SYRINGE | INTRAVENOUS | Status: AC
  Filled 2015-10-05: qty 20

## 2015-10-05 MED ORDER — DEXTROSE 5 % IV SOLN
5.0000 10*6.[IU] | Freq: Once | INTRAVENOUS | Status: AC
  Administered 2015-10-05: 5 10*6.[IU] via INTRAVENOUS
  Filled 2015-10-05: qty 5

## 2015-10-05 MED ORDER — OXYCODONE-ACETAMINOPHEN 5-325 MG PO TABS: 1.0000 | ORAL_TABLET | ORAL | Status: DC | PRN

## 2015-10-05 MED ORDER — ACETAMINOPHEN 325 MG PO TABS
650.0000 mg | ORAL_TABLET | ORAL | Status: DC | PRN
  Administered 2015-10-05: 650 mg via ORAL
  Filled 2015-10-05: qty 2

## 2015-10-05 MED ORDER — OXYTOCIN BOLUS FROM INFUSION
500.0000 mL | INTRAVENOUS | Status: DC
  Administered 2015-10-06: 500 mL via INTRAVENOUS

## 2015-10-05 MED ORDER — OXYTOCIN 40 UNITS IN LACTATED RINGERS INFUSION - SIMPLE MED
1.0000 m[IU]/min | INTRAVENOUS | Status: DC
  Administered 2015-10-05: 2 m[IU]/min via INTRAVENOUS

## 2015-10-05 MED ORDER — FENTANYL 2.5 MCG/ML BUPIVACAINE 1/10 % EPIDURAL INFUSION (WH - ANES)
INTRAMUSCULAR | Status: AC
  Administered 2015-10-05: 14 mL/h via EPIDURAL
  Filled 2015-10-05: qty 125

## 2015-10-05 MED ORDER — DIPHENHYDRAMINE HCL 50 MG/ML IJ SOLN: 12.5000 mg | INTRAMUSCULAR | Status: DC | PRN

## 2015-10-05 MED ORDER — PHENYLEPHRINE 40 MCG/ML (10ML) SYRINGE FOR IV PUSH (FOR BLOOD PRESSURE SUPPORT)
80.0000 ug | PREFILLED_SYRINGE | INTRAVENOUS | Status: DC | PRN
  Filled 2015-10-05: qty 10

## 2015-10-05 MED ORDER — LACTATED RINGERS IV SOLN
500.0000 mL | Freq: Once | INTRAVENOUS | Status: AC
Start: 2015-10-05 — End: 2015-10-05
  Administered 2015-10-05: 500 mL via INTRAVENOUS

## 2015-10-05 MED ORDER — OXYTOCIN 40 UNITS IN LACTATED RINGERS INFUSION - SIMPLE MED
2.5000 [IU]/h | INTRAVENOUS | Status: DC
  Administered 2015-10-06: 2.5 [IU]/h via INTRAVENOUS
  Filled 2015-10-05: qty 1000

## 2015-10-05 MED ORDER — FENTANYL CITRATE (PF) 100 MCG/2ML IJ SOLN: 100.0000 ug | INTRAMUSCULAR | Status: DC | PRN

## 2015-10-05 NOTE — Anesthesia Procedure Notes (Addendum)
Epidural Patient location during procedure: OB Start time: 10/05/2015 10:22 AM End time: 10/05/2015 10:32 AM  Staffing Anesthesiologist: Leilani AbleHATCHETT, Jlynn Langille Performed by: anesthesiologist   Preanesthetic Checklist Completed: patient identified, surgical consent, pre-op evaluation, timeout performed, IV checked, risks and benefits discussed and monitors and equipment checked  Epidural Patient position: sitting Prep: site prepped and draped and DuraPrep Patient monitoring: continuous pulse ox and blood pressure Approach: midline Injection technique: LOR air  Needle:  Needle type: Tuohy  Needle gauge: 17 G Needle length: 9 cm and 9 Needle insertion depth: 8 cm Catheter size: 19 Gauge Catheter at skin depth: 14 cm Test dose: negative and Other  Assessment Sensory level: T10 Events: blood not aspirated, injection not painful, no injection resistance, negative IV test and no paresthesia  Additional Notes Reason for block:procedure for pain  Epidural Patient location during procedure: OB Start time: 10/05/2015 5:00 PM End time: 10/05/2015 5:06 PM  Staffing Anesthesiologist: Leilani AbleHATCHETT, Brenee Gajda Performed by: anesthesiologist   Preanesthetic Checklist Completed: patient identified, site marked, surgical consent, pre-op evaluation, timeout performed, IV checked, risks and benefits discussed and monitors and equipment checked  Epidural Patient position: sitting Prep: site prepped and draped and DuraPrep Patient monitoring: continuous pulse ox and blood pressure Approach: midline Location: L3-L4 Injection technique: LOR air  Needle:  Needle type: Tuohy  Needle gauge: 17 G Needle length: 9 cm and 9 Needle insertion depth: 7 cm Catheter type: closed end flexible Catheter size: 19 Gauge Catheter at skin depth: 12 cm Test dose: negative and Other  Assessment Sensory level: T9 Events: blood not aspirated, injection not painful, no injection resistance, negative IV test and  no paresthesia

## 2015-10-05 NOTE — MAU Note (Signed)
Notice leaking of fluid around 4am after coming back from the bathroom and a steady trickle since.   Cramping started before the leaking of fluid but not regular.

## 2015-10-05 NOTE — H&P (Signed)
Julia Acosta is a 30 y.o. female G1P0 @[redacted]w[redacted]d  by LMP pt of [facility] office presenting for onset of contractions and rupture of membranes at 4 am this morning. Fluid is clear without odor.  Contractions are becoming stronger since leakage of fluid started.  She reports good fetal movement, denies vaginal bleeding, vaginal itching/burning, urinary symptoms, h/a, dizziness, n/v, or fever/chills.    Pregnancy has been uncomplicated.  She had 36 week US r/t obesity in pregnancy with large EFW.   At 5765w0d EFW 8lb (3635g/>90%), AC >97%, AFI 11.63 cm, cephalic.  Clinic [facility] Prenatal Labs  Dating LMP Blood type:   O+  Genetic Screen Normal NT/NB 1 Screen:        AFP:   WNL   Antibody:NEG (10/31 0959)  Anatomic US  normal but limited views of heart, rec repeat Rubella: 1.99 (10/31 0959)  GTT Early:  84             Third trimester: 06/30/2015:127 RPR: NON REAC (10/31 0959)   Flu vaccine  10/16 HBsAg: NEGATIVE (10/31 0959)   TDaP vaccine  3/17                                       HIV: NONREACTIVE (10/31 0959)   Baby Food  breast                                         GBS:  POSITIVE IN URINE (10/31)  Contraception condoms Pap: normal 10/16  Circumcision  yes   [facility]ediatrician [facility]   Support Person Husband- Oswald HillockBilal Ahmad      Maternal Medical History:  Reason for admission: Rupture of membranes.  Nausea.  Contractions: Onset was 3-5 hours ago.   Frequency: regular.   Duration is approximately 1 minute.   Perceived severity is moderate.    Fetal activity: Perceived fetal activity is normal.   Last perceived fetal movement was within the past hour.    Prenatal complications: no prenatal complications Prenatal Complications - Diabetes: none.    OB History    Gravida Para Term Preterm AB TAB SAB Ectopic Multiple Living   1 0             Past Medical History  Diagnosis Date  . Medical history non-contributory   . UTI (urinary tract infection)   .  Bartholin cyst    Past Surgical History  Procedure Laterality Date  . No past surgeries    . Abcess drainage      Bartholin cyst   Family History: family history includes Diabetes in her father and mother; Hyperlipidemia in her father; Hypertension in her father and mother. There is no history of Cancer or Stroke. Social History:  reports that she has never smoked. She does not have any smokeless tobacco history on file. She reports that she does not drink alcohol or use illicit drugs.   Prenatal Transfer Tool  Maternal Diabetes: No Genetic Screening: Normal Maternal Ultrasounds/Referrals: Normal Fetal Ultrasounds or other Referrals:  None Maternal Substance Abuse:  No Significant Maternal Medications:  None Significant Maternal Lab Results:  Lab values include: Group B Strep positive Other Comments:  None  Review of Systems  Constitutional: Negative for fever, chills and malaise/fatigue.  Eyes: Negative for blurred vision.  Respiratory: Negative for cough and shortness of breath.   Cardiovascular: Negative for chest pain.  Gastrointestinal: Positive for abdominal pain. Negative for heartburn, nausea and vomiting.  Genitourinary: Negative for dysuria, urgency and frequency.  Musculoskeletal: Negative.   Neurological: Negative for dizziness and headaches.  Psychiatric/Behavioral: Negative for depression.    Dilation: 4.5 Effacement (%): 80 Station: -1 Exam by:: Jolynn RN Blood pressure 137/92, pulse 91, temperature 98.4 F (36.9 C), temperature source Oral, resp. rate 16, height  (1.6 m), weight 238 lb (107.956 kg), last menstrual period 01/10/2015. Maternal Exam:  Uterine Assessment: Contraction strength is mild.  Contraction duration is 60 seconds. Contraction frequency is irregular.      Fetal Exam Fetal Monitor Review: Baseline rate: 140.  Variability: moderate (6-25 bpm).   Pattern: accelerations present and no decelerations.    Fetal State Assessment:  Category I - tracings are normal.     Physical Exam  Nursing note and vitals reviewed. Constitutional: She is oriented to person, place, and time. She appears well-developed and well-nourished.  Neck: Normal range of motion.  Cardiovascular: Normal rate, regular rhythm and normal heart sounds.   Respiratory: Effort normal and breath sounds normal.  GI: Soft.  Musculoskeletal: Normal range of motion.  Neurological: She is alert and oriented to person, place, and time. She has normal reflexes.  Skin: Skin is warm and dry.  Psychiatric: She has a normal mood and affect. Her behavior is normal. Judgment and thought content normal.    Prenatal labs: ABO, Rh: O/POS/-- (10/31 0959) Antibody: NEG (10/31 0959) Rubella: 1.99 (10/31 0959) RPR: NON REAC (02/16 1121)  HBsAg: NEGATIVE (10/31 0959)  HIV: NONREACTIVE (02/16 1121)  GBS:   Positive in urine early pregnancy GTT: Early 84, 28 weeks 127  Assessment/Plan: G1P0 @ [redacted]w[redacted]d by LMP SROM in early labor GBS positive Large EFW  ([redacted]w[redacted]d EFW 8lb (3635g/>90%), AC >97%), US done for obesity in pregnancy Prefers female providers  Admit to [facility] Expectant management of early labor PCN for GBS prophylaxis Anticipate NSVD  LEFTWICH-KIRBY, Khiley Lieser 10/05/2015, 8:22 AM

## 2015-10-05 NOTE — Progress Notes (Signed)
Labor Progress Note Julia Acosta is a 30 y.o. G1P0 at 1459w6d presented for PROM.  S: No complaint. Says contraction are better. Denies headache, vision change. Wants circ. Breastfeeding. Nexplanon for BC  O:  BP 98/64 mmHg  Pulse 137  Temp(Src) 98.1 F (36.7 C) (Oral)  Resp 18  Ht 5\' 3"  (1.6 m)  Wt 238 lb (107.956 kg)  BMI 42.17 kg/m2  SpO2 96%  LMP 01/10/2015 (Within Weeks) EFM: 140/mod/accels  CVE: Dilation: 5 Effacement (%): 80 Cervical Position: Middle Station: -2 Presentation: Vertex Exam by:: M.Merrill, RN    A&P: 30 y.o. G1P0 3059w6d with PROM and doing fine #Labor: progressing slowly. Continue pit #Pain: Epidural #FWB: CAT-1 #GBS: positive. Treating  Almon Herculesaye T Gonfa, MD 5:48 PM

## 2015-10-05 NOTE — Anesthesia Preprocedure Evaluation (Addendum)
Anesthesia Evaluation  Patient identified by MRN, date of birth, ID band Patient awake    Reviewed: Allergy & Precautions, H&P , NPO status , Patient's Chart, lab work & pertinent test results  Airway Mallampati: II  TM Distance: >3 FB Neck ROM: full    Dental no notable dental hx.    Pulmonary neg pulmonary ROS,    Pulmonary exam normal        Cardiovascular negative cardio ROS Normal cardiovascular exam     Neuro/Psych negative neurological ROS  negative psych ROS   GI/Hepatic negative GI ROS, Neg liver ROS,   Endo/Other  Morbid obesity  Renal/GU negative Renal ROS     Musculoskeletal   Abdominal (+) + obese,   Peds  Hematology negative hematology ROS (+)   Anesthesia Other Findings   Reproductive/Obstetrics (+) Pregnancy                             Anesthesia Physical Anesthesia Plan  ASA: II  Anesthesia Plan: Epidural   Post-op Pain Management:    Induction:   Airway Management Planned:   Additional Equipment:   Intra-op Plan:   Post-operative Plan:   Informed Consent: I have reviewed the patients History and Physical, chart, labs and discussed the procedure including the risks, benefits and alternatives for the proposed anesthesia with the patient or authorized representative who has indicated his/her understanding and acceptance.     Plan Discussed with:   Anesthesia Plan Comments:        Anesthesia Quick Evaluation

## 2015-10-05 NOTE — Progress Notes (Signed)
Julia Acosta is a 30 y.o. G1P0 at 426w6d admitted for PROM  Subjective: Feeling ctx more despite epidural  Objective: BP 105/44 mmHg  Pulse 103  Temp(Src) 97.9 F (36.6 C) (Oral)  Resp 18  Ht 5\' 3"  (1.6 m)  Wt 107.956 kg (238 lb)  BMI 42.17 kg/m2  SpO2 100%  LMP 01/10/2015 (Within Weeks) I/O last 3 completed shifts: In: -  Out: 1000 [Urine:1000]    FHT:  FHR: 150s bpm, variability: moderate,  accelerations:  Present,  decelerations:  Absent- occ mi variables UC:   irregular, every 2-5 minutes w/ Pit @ 10 mu/min SVE:   Dilation: 8 Effacement (%): 90 Station: -1, 0 Exam by:: Irving BurtonEmily Rothermel RN   Labs: Lab Results  Component Value Date   WBC 10.5 10/05/2015   HGB 12.2 10/05/2015   HCT 36.9 10/05/2015   MCV 82.9 10/05/2015   PLT 314 10/05/2015    Assessment / Plan: IUP@term  ROM x 17hrs Active labor  Epidural PCA Plan to check cx in 2 hrs or sooner prn pressure Anticipate SVD  Ova Gillentine CNM 10/05/2015, 8:59 PM

## 2015-10-05 NOTE — Anesthesia Pain Management Evaluation Note (Signed)
  CRNA Pain Management Visit Note  Patient: Julia Acosta, 30 y.o., female  "Hello I am a member of the anesthesia [facility]. We have an anesthesia team available at all times to provide care throughout the hospital, including epidural management and anesthesia for C-section. I don't know your plan for the delivery whether it a natural birth, water birth, IV sedation, nitrous supplementation, doula or epidural, but we want to meet your pain goals."   1.Was your pain managed to your expectations on prior hospitalizations?   No prior hospitalizations  2.What is your expectation for pain management during this hospitalization?     Epidural  3.How can we help you reach that goal? epidural  Record the patient's initial score and the patient's pain goal.   Pain: 6  Pain Goal: 6 [facility] wants you to be able to say your pain was always managed very well.  Cephus ShellingBURGER,Tia Gelb 10/05/2015

## 2015-10-06 ENCOUNTER — Encounter (HOSPITAL_COMMUNITY): Payer: Self-pay | Admitting: *Deleted

## 2015-10-06 DIAGNOSIS — Z3A39 39 weeks gestation of pregnancy: Secondary | ICD-10-CM

## 2015-10-06 DIAGNOSIS — O4202 Full-term premature rupture of membranes, onset of labor within 24 hours of rupture: Secondary | ICD-10-CM

## 2015-10-06 DIAGNOSIS — O99824 Streptococcus B carrier state complicating childbirth: Secondary | ICD-10-CM

## 2015-10-06 LAB — CBC
HEMATOCRIT: 30.1 % — AB (ref 36.0–46.0)
HEMOGLOBIN: 10.2 g/dL — AB (ref 12.0–15.0)
MCH: 27.9 pg (ref 26.0–34.0)
MCHC: 33.9 g/dL (ref 30.0–36.0)
MCV: 82.2 fL (ref 78.0–100.0)
Platelets: 266 10*3/uL (ref 150–400)
RBC: 3.66 MIL/uL — ABNORMAL LOW (ref 3.87–5.11)
RDW: 16.1 % — ABNORMAL HIGH (ref 11.5–15.5)
WBC: 14.4 10*3/uL — AB (ref 4.0–10.5)

## 2015-10-06 LAB — RPR: RPR: NONREACTIVE

## 2015-10-06 MED ORDER — PRENATAL MULTIVITAMIN CH
1.0000 | ORAL_TABLET | Freq: Every day | ORAL | Status: DC
Start: 2015-10-06 — End: 2015-10-08
  Administered 2015-10-06 – 2015-10-07 (×2): 1 via ORAL
  Filled 2015-10-06 (×2): qty 1

## 2015-10-06 MED ORDER — OXYCODONE-ACETAMINOPHEN 5-325 MG PO TABS
1.0000 | ORAL_TABLET | ORAL | Status: DC | PRN
  Administered 2015-10-08: 1 via ORAL
  Filled 2015-10-06: qty 1

## 2015-10-06 MED ORDER — COCONUT OIL OIL: 1.0000 "application " | TOPICAL_OIL | Status: DC | PRN

## 2015-10-06 MED ORDER — SIMETHICONE 80 MG PO CHEW
80.0000 mg | CHEWABLE_TABLET | ORAL | Status: DC | PRN
Start: 2015-10-06 — End: 2015-10-08

## 2015-10-06 MED ORDER — DIBUCAINE 1 % RE OINT: 1.0000 "application " | TOPICAL_OINTMENT | RECTAL | Status: DC | PRN

## 2015-10-06 MED ORDER — SENNOSIDES-DOCUSATE SODIUM 8.6-50 MG PO TABS
2.0000 | ORAL_TABLET | ORAL | Status: DC
  Administered 2015-10-06 – 2015-10-07 (×2): 2 via ORAL
  Filled 2015-10-06 (×2): qty 2

## 2015-10-06 MED ORDER — IBUPROFEN 600 MG PO TABS
600.0000 mg | ORAL_TABLET | Freq: Four times a day (QID) | ORAL | Status: DC
  Administered 2015-10-06 – 2015-10-08 (×9): 600 mg via ORAL
  Filled 2015-10-06 (×9): qty 1

## 2015-10-06 MED ORDER — WITCH HAZEL-GLYCERIN EX PADS: 1.0000 "application " | MEDICATED_PAD | CUTANEOUS | Status: DC | PRN

## 2015-10-06 MED ORDER — OXYCODONE-ACETAMINOPHEN 5-325 MG PO TABS: 2.0000 | ORAL_TABLET | ORAL | Status: DC | PRN

## 2015-10-06 MED ORDER — ACETAMINOPHEN 325 MG PO TABS: 325.0000 mg | ORAL_TABLET | Freq: Once | ORAL | Status: DC

## 2015-10-06 MED ORDER — DIPHENHYDRAMINE HCL 25 MG PO CAPS: 25.0000 mg | ORAL_CAPSULE | Freq: Four times a day (QID) | ORAL | Status: DC | PRN

## 2015-10-06 MED ORDER — SODIUM CHLORIDE 0.9 % IV SOLN
2.0000 g | Freq: Four times a day (QID) | INTRAVENOUS | Status: DC
  Administered 2015-10-06: 2 g via INTRAVENOUS
  Filled 2015-10-06 (×3): qty 2000

## 2015-10-06 MED ORDER — ONDANSETRON HCL 4 MG PO TABS: 4.0000 mg | ORAL_TABLET | ORAL | Status: DC | PRN

## 2015-10-06 MED ORDER — ONDANSETRON HCL 4 MG/2ML IJ SOLN: 4.0000 mg | INTRAMUSCULAR | Status: DC | PRN

## 2015-10-06 MED ORDER — ACETAMINOPHEN 325 MG PO TABS: 650.0000 mg | ORAL_TABLET | ORAL | Status: DC | PRN

## 2015-10-06 MED ORDER — BENZOCAINE-MENTHOL 20-0.5 % EX AERO
1.0000 | INHALATION_SPRAY | CUTANEOUS | Status: DC | PRN
Start: 2015-10-06 — End: 2015-10-08
  Filled 2015-10-06: qty 56

## 2015-10-06 MED ORDER — ZOLPIDEM TARTRATE 5 MG PO TABS: 5.0000 mg | ORAL_TABLET | Freq: Every evening | ORAL | Status: DC | PRN

## 2015-10-06 MED ORDER — TETANUS-DIPHTH-ACELL PERTUSSIS 5-2.5-18.5 LF-MCG/0.5 IM SUSP: 0.5000 mL | Freq: Once | INTRAMUSCULAR | Status: DC

## 2015-10-06 MED ORDER — GENTAMICIN SULFATE 40 MG/ML IJ SOLN
1.5000 mg/kg | Freq: Three times a day (TID) | INTRAVENOUS | Status: DC
  Filled 2015-10-06 (×2): qty 4

## 2015-10-06 NOTE — Lactation Note (Signed)
This note was copied from a baby's chart. Lactation Consultation Note  Patient Name: Julia Acosta ZOXWR'UToday's Date: 10/06/2015 Reason for consult: Initial assessment   With this mom of a term NICU baby, with RDS, now 7611 hours old. I started mom pumping in initiation setting, and showed her how to hand express. Mom demonstrated HE with very good technique. She was able to collect 2 ml's to bring to [place]. Mom will need 2 week rental pump at discharge. Mom knows to call for questions/concerns.    Maternal Data Formula Feeding for Exclusion: Yes (baby in NICU) Has patient been taught Hand Expression?: Yes Does the patient have breastfeeding experience prior to this delivery?: No  Feeding    LATCH Score/Interventions                      Lactation Tools Discussed/Used WIC Program: No Pump Review: Setup, frequency, and cleaning;Milk Storage;Other (comment) (hand expr, pump settings, NICU book review) Initiated by:: cLee RN IBCLC Date initiated:: 10/06/15   Consult Status Consult Status: Follow-up Date: 10/07/15 Follow-up type: In-patient    Alfred LevinsLee, Jet Armbrust Anne 10/06/2015, 1:08 PM

## 2015-10-06 NOTE — Anesthesia Postprocedure Evaluation (Signed)
Anesthesia Post Note  Patient: Julia Acosta  Procedure(s) Performed: * No procedures listed *  Patient location during evaluation: [facility] Anesthesia Type: Epidural Level of consciousness: awake, awake and alert, oriented and patient cooperative Pain management: pain level controlled Vital Signs Assessment: post-procedure vital signs reviewed and stable Respiratory status: spontaneous breathing, nonlabored ventilation and respiratory function stable Cardiovascular status: stable Postop Assessment: no headache, no backache, patient able to bend at knees and no signs of nausea or vomiting Anesthetic complications: no     Last Vitals:  Filed Vitals:   10/06/15 0344 10/06/15 0445  BP: 118/79 106/76  Pulse: 100 98  Temp: 37.7 C 37.5 C  Resp: 18 18    Last Pain:  Filed Vitals:   10/06/15 0749  PainSc: 4    Pain Goal: Patients Stated Pain Goal: 3 (10/06/15 0508)               Yuchen Fedor L

## 2015-10-07 ENCOUNTER — Encounter (HOSPITAL_COMMUNITY): Payer: Self-pay | Admitting: *Deleted

## 2015-10-07 NOTE — Progress Notes (Signed)
CSW acknowledges NICU admission.    Patient screened out for psychosocial assessment since none of the following apply:  Psychosocial stressors documented in mother or baby's chart  Gestation less than 32 weeks  Code at delivery   Infant with anomalies  Please contact the Clinical Social Worker if specific needs arise, or by MOB's request.       

## 2015-10-07 NOTE — Lactation Note (Addendum)
This note was copied from a baby's chart. Lactation Consultation Note  Patient Name: Julia Acosta FWYOV'Z Date: 10/07/2015 Reason for consult: Follow-up assessment  Baby 79 hours old. Mom has EBM in colostrum container at bedside--about 7 ml. Discussed EBM storage guidelines and how to transport milk back to NICU. Mom not sure if she will be discharged today or tomorrow. Enc mom to call insurance company about getting her DEBP. Mom has paperwork for 2-week DEBP rental. Discussed the benefits of hospital-grade DEBP and mom aware of pumping rooms in the NICU. Demonstrated how to use piston from pump kit to manually pump both breasts simultaneously. Mom states that she pumped 3 times yesterday. Enc mom to pump 8 times/24 hours followed by hand expressed. Discussed the normal progression of milk coming to volume, and breast engorgement prevention and treatment. Mom aware of OP/BFSG and [place] phone line assistance after D/C.  Maternal Data    Feeding Feeding Type: Formula Nipple Type: Slow - flow Length of feed: 10 min  LATCH Score/Interventions                      Lactation Tools Discussed/Used     Consult Status Consult Status: PRN    Inocente Salles 10/07/2015, 10:05 AM

## 2015-10-07 NOTE — Progress Notes (Signed)
Post Partum Day 1 Subjective:  Julia Acosta is a 30 y.o. G1P1001 4966w0d s/p SVD. Febrile to 101.2 just prior to delivery. No further fevers. No acute events overnight.  Pt denies problems with ambulating, voiding or po intake.  She denies nausea or vomiting.  Pain is well controlled.  Lochia Minimal.  Plan for birth control is Nexplanon.  Method of Feeding: breast.  Baby with tachycardia and tachypnea after birth. Transferred to NICU for sepsis rule out. On Amp and Gent. Otherwise doing fine.  Objective: Blood pressure 93/61, pulse 81, temperature 98.1 F (36.7 C), temperature source Oral, resp. rate 17, height 5\' 3"  (1.6 m), weight 238 lb (107.956 kg), last menstrual period 01/10/2015, SpO2 99 %, unknown if currently breastfeeding.  Physical Exam:  General: alert, cooperative and no distress Lochia:normal flow Chest: normal WOB Heart: Regular rate Abdomen: +BS, soft, mild TTP (appropriate) Uterine Fundus: firm, below belly button DVT Evaluation: No evidence of DVT seen on physical exam. Extremities: no edema   Recent Labs  10/05/15 0920 10/06/15 0538  HGB 12.2 10.2*  HCT 36.9 30.1*    Assessment/Plan:  ASSESSMENT: Julia Acosta is a 30 y.o. G1P1001 7966w0d s/p SVD. Afebrile since delivery. S/p Ampicillin once Baby in NICU for sepsis rule out. On Amp and Gent. Otherwise doing fine. Continue routine PP care Breastfeeding support PRN Plan for discharge tomorrow   LOS: 2 days   Almon Herculesaye T Andrell Bergeson 10/07/2015, 7:48 AM

## 2015-10-08 ENCOUNTER — Ambulatory Visit: Payer: Self-pay

## 2015-10-08 MED ORDER — IBUPROFEN 600 MG PO TABS: 600.0000 mg | ORAL_TABLET | Freq: Four times a day (QID) | ORAL | Status: DC

## 2015-10-08 MED ORDER — ACETAMINOPHEN 325 MG PO TABS
650.0000 mg | ORAL_TABLET | ORAL | Status: DC | PRN
Start: 2015-10-08 — End: 2017-09-04

## 2015-10-08 NOTE — Discharge Instructions (Signed)

## 2015-10-08 NOTE — Lactation Note (Signed)
This note was copied from a baby's chart. Lactation Consultation Note  Patient Name: Julia Acosta YNWGN'FToday's Date: 10/08/2015 Reason for consult: Follow-up assessment;NICU baby [facility] is 2260 hrs [facility] Nurse called LC because mom was at bedside & was wanting to try BF with a nipple shield. Tried latching baby on right breast with no nipple shield first in cross-cradle hold but baby was unable to latch. Mom stated she was leaking from her left so wanted to try a nipple shield with that breast - first mom tried cross-cradle & then mom switched into cradle hold but needed assistance. Tried nipple shield size 20mm - baby latched for a little but then started crying again & would not create a seal. Tried feeding baby via finger with syringe & baby took a little bit of her breastmilk but then would not open his mouth again. After trying BF without shield, mom asked to just feed him by the bottle. Encouraged mom to continue trying latching & to make sure she continues to pump at least 8x in 24 hrs. Mom reports no questions at this time.  Maternal Data    Feeding Feeding Type: Breast Fed Nipple Type: Slow - flow  LATCH Score/Interventions Latch: Repeated attempts needed to sustain latch, nipple held in mouth throughout feeding, stimulation needed to elicit sucking reflex. Intervention(s): Adjust position;Assist with latch;Breast compression  Audible Swallowing: None Intervention(s): Skin to skin  Type of Nipple: Flat (short shaft that flattens when trying to latch) Intervention(s): Shells  Comfort (Breast/Nipple): Soft / non-tender     Hold (Positioning): Assistance needed to correctly position infant at breast and maintain latch. Intervention(s): Support Pillows;Breastfeeding basics reviewed  LATCH Score: 5  Lactation Tools Discussed/Used Tools: Nipple Shields Nipple shield size: 20   Consult Status Consult Status: PRN    Oneal GroutLaura C  Jeananne Bedwell 10/08/2015, 2:56 PM

## 2015-10-08 NOTE — Lactation Note (Signed)
This note was copied from a baby's chart. Lactation Consultation Note  Patient Name: Boy Pola CornMaryyam Cumberledge LKGMW'NToday's Date: 10/08/2015 Reason for consult: Follow-up assessment;NICU baby Infant is 9258 hours old & seen by lactation for follow-up assessment. Baby is in the NICU and mom had recently pumped some milk, which she was going to bring down to the NICU. Mom is getting discharged today. Mom stated she has pumped 2x this morning & once during the night but has not been doing it 8x/ in 24 hrs - reinforced importance of regular stimulation through DEBP at least 8 x in 24hrs and hand expression. Previous LC had given mom paperwork for 2 week DEBP rental but mom stated today that she wants to go home with just the hand pump and see how she does with that. Discussed differences between DEBP and manual pump - encouraged pt to bring pump parts to use when she is in the NICU and to let us know if she changes her mind about the DEBP rental. Mom stated they have not called insurance yet to see about getting a DEBP from them. Mom stated that when she tried latching this morning that her nipples would not come out so she had difficulty latching & stated that her nurse had mentioned that she may need a nipple shield; discussed how this would need to be fitted so suggested mom to call LC when she was down in the NICU so we could try it with baby BF as well. Mom agreeable.  Maternal Data    Feeding Feeding Type: Breast Milk with Formula added Nipple Type: Slow - flow Length of feed: 15 min  LATCH Score/Interventions                      Lactation Tools Discussed/Used     Consult Status Consult Status: PRN    Oneal GroutLaura C Niesha Bame 10/08/2015, 11:23 AM

## 2015-10-08 NOTE — Discharge Summary (Signed)
OB Discharge Summary     Patient Name: Julia Acosta DOB: 1986-05-07 MRN: [id number]  Date of admission: 10/05/2015 Delivering MD: Araceli BoucheUMLEY, [place]   Date of discharge: 10/08/2015  Admitting diagnosis: 38WKS,LABOR Intrauterine pregnancy: 6429w0d     Secondary diagnosis:  Active Problems:   Active labor at term  Additional problems: prom     Discharge diagnosis: Term Pregnancy Delivered                                                                                                Post partum procedures:none  Augmentation: Pitocin  Complications: triple i diagnosed just prior to delivery treated with one dose ampicillin  Hospital course:  Onset of Labor With Vaginal Delivery     30 y.o. yo G1P1001 at 1829w0d was admitted with prom on 10/05/2015. Patient had a labor course as follows:  Membrane Rupture Time/Date: 4:00 AM ,10/05/2015   Intrapartum Procedures: Episiotomy: None [1]                                         Lacerations:  2nd degree [3];Vaginal [6]  Patient had a delivery of a Viable infant. 10/06/2015  Information for the patient's newborn:  Julia Acosta, Boy Julia Acosta [[id number]][[id number]]  Delivery Method: Vaginal, Spontaneous Delivery (Filed from Delivery Summary)   No signs endometritis PP.  Pateint had an uncomplicated postpartum course.  She is ambulating, tolerating a regular diet, passing flatus, and urinating well. Patient is discharged home in stable condition on 10/08/2015.    Physical exam  Filed Vitals:   10/07/15 0551 10/07/15 1207 10/07/15 2054 10/08/15 0514  BP: 93/61 104/59 122/96 119/64  Pulse: 81 78 76 90  Temp: 98.1 F (36.7 C) 98.3 F (36.8 C) 98.6 F (37 C) 98.5 F (36.9 C)  TempSrc: Oral Oral Oral Oral  Resp: 17 18 18 16   Height:      Weight:      SpO2: 99% 100% 100% 100%   General: alert, cooperative and no distress Lochia: appropriate Uterine Fundus: firm Incision: N/A DVT Evaluation: No cords or calf tenderness. No significant calf/ankle  edema. Labs: Lab Results  Component Value Date   WBC 14.4* 10/06/2015   HGB 10.2* 10/06/2015   HCT 30.1* 10/06/2015   MCV 82.2 10/06/2015   PLT 266 10/06/2015   CMP Latest Ref Rng 10/05/2015  Glucose 65 - 99 mg/dL 83  BUN 6 - 20 mg/dL 8  Creatinine 7.840.44 - 6.961.00 mg/dL 2.950.52  Sodium 284135 - 132145 mmol/L 137  Potassium 3.5 - 5.1 mmol/L 4.1  Chloride 101 - 111 mmol/L 107  CO2 22 - 32 mmol/L 20(L)  Calcium 8.9 - 10.3 mg/dL 9.0  Total Protein 6.5 - 8.1 g/dL 6.8  Total Bilirubin 0.3 - 1.2 mg/dL 0.5  Alkaline Phos 38 - 126 U/L 164(H)  AST 15 - 41 U/L 19  ALT 14 - 54 U/L 15    Discharge instruction: per After Visit Summary and "Baby and Me Booklet".  After visit meds:  Medication List    STOP taking these medications        dextromethorphan-guaiFENesin 30-600 MG 12hr tablet  Commonly known as:  MUCINEX DM     ferrous sulfate 325 (65 FE) MG tablet     ranitidine 150 MG tablet  Commonly known as:  ZANTAC     traMADol 50 MG tablet  Commonly known as:  ULTRAM      TAKE these medications        acetaminophen 325 MG tablet  Commonly known as:  TYLENOL  Take 2 tablets (650 mg total) by mouth every 4 (four) hours as needed (for pain scale < 4).     ibuprofen 600 MG tablet  Commonly known as:  ADVIL,MOTRIN  Take 1 tablet (600 mg total) by mouth every 6 (six) hours.     prenatal multivitamin Tabs tablet  Take 1 tablet by mouth daily at 12 noon.        Diet: routine diet  Activity: Advance as tolerated. Pelvic rest for 6 weeks.   Outpatient follow up:6 weeks Follow up Appt:Future Appointments Date Time Provider Department Center  10/13/2015 11:00 AM Julia Rosenthal, MD [facility] None   Follow up Visit:No Follow-up on file.  Postpartum contraception: Nexplanon  Newborn Data: Live born female  Birth Weight: 6 lb 8.8 oz (2970 g) APGAR: 5, 8  Baby Feeding: Breast Disposition:NICU   10/08/2015 Julia Bilis, MD

## 2015-10-13 ENCOUNTER — Encounter: Payer: BLUE CROSS/BLUE SHIELD | Admitting: Obstetrics & Gynecology

## 2015-11-21 ENCOUNTER — Encounter: Payer: Self-pay | Admitting: Obstetrics & Gynecology

## 2015-11-21 ENCOUNTER — Ambulatory Visit (INDEPENDENT_AMBULATORY_CARE_PROVIDER_SITE_OTHER): Payer: BLUE CROSS/BLUE SHIELD | Admitting: Obstetrics & Gynecology

## 2015-11-21 NOTE — Progress Notes (Signed)
Post Partum Exam  Julia Acosta is a 30 y.o. M Asian 741P1001(son)  female who presents for a postpartum visit. She is 6 weeks postpartum following a spontaneous vaginal delivery. I have fully reviewed the prenatal and intrapartum course. The delivery was at  38 gestational weeks.  Anesthesia: epidural. Postpartum course has been unremarkable. Baby's course has beenunremarkable. He was in the NICU for the first 5 days. Baby is feeding by breast after she pumps as he is having difficulty latching on. Bleeding thin lochia. Bowel function is normal. Bladder function is normal. Patient is not sexually active. Contraception method is condoms. Postpartum depression screening: negative.  The following portions of the patient's history were reviewed and updated as appropriate: allergies, current medications, past family history, past medical history, past social history, past surgical history and problem list.  Review of Systems Pertinent items are noted in HPI.   Objective:    BP 116/78 mmHg  Pulse 78  Resp 16  Ht 5\' 5"  (1.651 m)  Wt 211 lb (95.709 kg)  BMI 35.11 kg/m2  Breastfeeding? Yes  General:  alert   Breasts:  inspection negative, no nipple discharge or bleeding, no masses or nodularity palpable  Lungs: clear to auscultation bilaterally  Heart:  regular rate and rhythm, S1, S2 normal, no murmur, click, rub or gallop  Abdomen: soft, non-tender; bowel sounds normal; no masses,  no organomegaly   Vulva:  normal  Vagina: normal vagina  Cervix:  anteverted  Corpus: normal  Adnexa:  no mass, fullness, tenderness  Rectal Exam: Not performed.        Assessment:    Normal  postpartum exam. Pap smear not done at today's visit.   Plan:   Contraception: condoms RTC 1 year/prn sooner

## 2016-01-23 ENCOUNTER — Encounter: Payer: Self-pay | Admitting: Obstetrics & Gynecology

## 2016-01-23 ENCOUNTER — Ambulatory Visit (INDEPENDENT_AMBULATORY_CARE_PROVIDER_SITE_OTHER): Payer: BLUE CROSS/BLUE SHIELD | Admitting: Obstetrics & Gynecology

## 2016-01-23 VITALS — BP 116/77 | HR 69 | Ht 63.0 in | Wt 220.0 lb

## 2016-01-23 DIAGNOSIS — N912 Amenorrhea, unspecified: Secondary | ICD-10-CM

## 2016-01-23 DIAGNOSIS — Z3202 Encounter for pregnancy test, result negative: Secondary | ICD-10-CM

## 2016-01-23 LAB — POCT URINE PREGNANCY: Preg Test, Ur: NEGATIVE

## 2016-01-23 NOTE — Progress Notes (Signed)
   GYNECOLOGY VISIT NOTE  History:  30 y.o. G1P1001 here today for amenorrhea since birth of child; had uncomplicated SVD on 10/06/15.  She is breastfeeding. Accompanied by her husband and her baby.  She denies any abnormal vaginal discharge, bleeding, pelvic pain or other concerns.   Past Medical History:  Diagnosis Date  . Bartholin cyst   . UTI (urinary tract infection)     Past Surgical History:  Procedure Laterality Date  . ABCESS DRAINAGE     Bartholin cyst    The following portions of the patient's history were reviewed and updated as appropriate: allergies, current medications, past family history, past medical history, past social history, past surgical history and problem list.   Health Maintenance:  Normal pap on 03/14/15.    Review of Systems:  Pertinent items noted in HPI and remainder of comprehensive ROS otherwise negative.  Objective:  Physical Exam BP 116/77   Pulse 69   Ht 5\' 3"  (1.6 m)   Wt 220 lb (99.8 kg)   Breastfeeding? Yes   BMI 38.97 kg/m  CONSTITUTIONAL: Well-developed, well-nourished female in no acute distress.  HENT:  Normocephalic, atraumatic. External right and left ear normal. Oropharynx is clear and moist EYES: Conjunctivae and EOM are normal. Pupils are equal, round, and reactive to light. No scleral icterus.  NECK: Normal range of motion, supple, no masses SKIN: Skin is warm and dry. No rash noted. Not diaphoretic. No erythema. No pallor. NEUROLOGIC: Alert and oriented to person, place, and time. Normal reflexes, muscle tone coordination. No cranial nerve deficit noted. PSYCHIATRIC: Normal mood and affect. Normal behavior. Normal judgment and thought content. CARDIOVASCULAR: Normal heart rate noted RESPIRATORY: Effort and breath sounds normal, no problems with respiration noted ABDOMEN: Soft, no distention noted.   PELVIC: Deferred MUSCULOSKELETAL: Normal range of motion. No edema noted.  Labs and Imaging Results for orders placed or  performed in visit on 01/23/16 (from the past 24 hour(s))  POCT urine pregnancy     Status: Normal   Collection Time: 01/23/16 10:22 AM  Result Value Ref Range   Preg Test, Ur Negative Negative     Assessment & Plan:  Lactational amenorrhea Explained that this is a normal physiologic response; patient was reassured.  Also stressed that it is not perfect contraception, encouraged continued use of condoms. All other questions answered to her satisfaction. Routine preventative health maintenance measures emphasized. Please refer to After Visit Summary for other counseling recommendations.   Total face-to-face time with patient: 15 minutes. Over 50% of encounter was spent on counseling and coordination of care.   Jaynie CollinsUGONNA  ANYANWU, MD, FACOG Attending Obstetrician & Gynecologist, [facility], [facility]

## 2017-01-04 ENCOUNTER — Other Ambulatory Visit: Payer: BLUE CROSS/BLUE SHIELD

## 2017-01-08 ENCOUNTER — Other Ambulatory Visit (INDEPENDENT_AMBULATORY_CARE_PROVIDER_SITE_OTHER): Payer: BLUE CROSS/BLUE SHIELD

## 2017-01-08 VITALS — Ht 63.0 in | Wt 222.0 lb

## 2017-01-08 DIAGNOSIS — N912 Amenorrhea, unspecified: Secondary | ICD-10-CM

## 2017-01-08 DIAGNOSIS — Z3201 Encounter for pregnancy test, result positive: Secondary | ICD-10-CM

## 2017-01-08 LAB — POCT URINE PREGNANCY: PREG TEST UR: POSITIVE — AB

## 2017-01-08 NOTE — Progress Notes (Signed)
Patient presents for pregnancy test. Patient LMP: 11-24-16.  Patient EDD is 08-31-17. Patient denies any pain or bleeding at this time. Dating by LMP makes her 6 weeks and 3 days. Armandina Stammer RNBSN

## 2017-02-05 ENCOUNTER — Other Ambulatory Visit (HOSPITAL_COMMUNITY)
Admission: RE | Admit: 2017-02-05 | Discharge: 2017-02-05 | Disposition: A | Discharge status: Home / Self Care | Payer: BLUE CROSS/BLUE SHIELD | Source: Ambulatory Visit | Attending: Advanced Practice Midwife | Admitting: Advanced Practice Midwife

## 2017-02-05 ENCOUNTER — Encounter: Payer: Self-pay | Admitting: Advanced Practice Midwife

## 2017-02-05 ENCOUNTER — Ambulatory Visit (INDEPENDENT_AMBULATORY_CARE_PROVIDER_SITE_OTHER): Payer: BLUE CROSS/BLUE SHIELD | Admitting: Advanced Practice Midwife

## 2017-02-05 VITALS — BP 101/76 | HR 84 | Wt 220.0 lb

## 2017-02-05 DIAGNOSIS — Z3480 Encounter for supervision of other normal pregnancy, unspecified trimester: Secondary | ICD-10-CM | POA: Insufficient documentation

## 2017-02-05 DIAGNOSIS — Z3481 Encounter for supervision of other normal pregnancy, first trimester: Secondary | ICD-10-CM

## 2017-02-05 DIAGNOSIS — Z3687 Encounter for antenatal screening for uncertain dates: Secondary | ICD-10-CM

## 2017-02-05 DIAGNOSIS — Z3491 Encounter for supervision of normal pregnancy, unspecified, first trimester: Secondary | ICD-10-CM

## 2017-02-05 DIAGNOSIS — Z23 Encounter for immunization: Secondary | ICD-10-CM

## 2017-02-05 NOTE — Progress Notes (Signed)
  Subjective:    Julia Acosta is a G2P1001 [redacted]w[redacted]d being seen today for her first obstetrical visit.  Her obstetrical history is significant for obesity. Patient does intend to breast feed. Pregnancy history fully reviewed.  Patient reports nausea and intermittent drainage from her Bartholin cyst. Had I&D with Word catheter in 2016.  Since then it drains intermittently.  No pain.  Vitals:   02/05/17 1040  BP: 101/76  Pulse: 84  Weight: 220 lb (99.8 kg)    HISTORY: OB History  Gravida Para Term Preterm AB Living  SAB TAB Ectopic Multiple Live Births        0 1    # Outcome Date GA Lbr Len/2nd Weight Sex Delivery Anes PTL Lv  2 Current           1 Term 10/06/15 [redacted]w[redacted]d 15:32 / 01:35 6 lb 8.8 oz (2.97 kg) M Vag-Spont EPI, Local  LIV     Past Medical History:  Diagnosis Date  . Bartholin cyst   . UTI (urinary tract infection)    Past Surgical History:  Procedure Laterality Date  . ABCESS DRAINAGE     Bartholin cyst   Family History  Problem Relation Age of Onset  . Diabetes Mother   . Hypertension Mother   . Hypertension Father   . Diabetes Father   . Hyperlipidemia Father   . Cancer Neg Hx   . Stroke Neg Hx      Exam    Uterus:  Fundal Height: 9 cm  Pelvic Exam:    Perineum: Normal Perineum   Vulva: Bartholin's, Urethra, Skene's normal, left draining Bartholin cyst   Vagina:  normal discharge   pH:    Cervix: multiparous appearance   Adnexa: no mass, fullness, tenderness   Bony Pelvis: gynecoid  System: Breast:  normal appearance, no masses or tenderness   Skin: normal coloration and turgor, no rashes    Neurologic: oriented, grossly non-focal   Extremities: normal strength, tone, and muscle mass   HEENT neck supple with midline trachea   Mouth/Teeth mucous membranes moist, pharynx normal without lesions   Neck supple and no masses   Cardiovascular: regular rate and rhythm   Respiratory:  appears well, vitals normal, no respiratory distress,  acyanotic, normal RR, ear and throat exam is normal, neck free of mass or lymphadenopathy, chest clear, no wheezing, crepitations, rhonchi, normal symmetric air entry   Abdomen: soft, non-tender; bowel sounds normal; no masses,  no organomegaly   Urinary: urethral meatus normal      Assessment:    Pregnancy: G2P1001 Patient Active Problem List   Diagnosis Date Noted  . Supervision of other normal pregnancy, antepartum 02/05/2017        Plan:     Initial labs drawn. Prenatal vitamins. Problem list reviewed and updated. Genetic Screening discussed First Screen: ordered.  Ultrasound discussed; fetal survey: requested.  Follow up in 3 weeks per [employer]. 50% of 30 min visit spent on counseling and coordination of care.   Welcomed back to practice Routines reviewed She opts in for [employer], oriented to it by RN Discussed Bartholins.  If it enlarges, or gets painful, let us know Does want flu shot Does want first trimester screen     Wynelle Bourgeois 02/05/2017

## 2017-02-05 NOTE — Patient Instructions (Signed)
Influenza Virus Vaccine (Flucelvax) What is this medicine? INFLUENZA VIRUS VACCINE (in floo EN zuh VAHY ruhs vak SEEN) helps to reduce the risk of getting influenza also known as the flu. The vaccine only helps protect you against some strains of the flu. This medicine may be used for other purposes; ask your health care provider or pharmacist if you have questions. COMMON BRAND NAME(S): FLUCELVAX What should I tell my health care provider before I take this medicine? They need to know if you have any of these conditions: -bleeding disorder like hemophilia -fever or infection -Guillain-Barre syndrome or other neurological problems -immune system problems -infection with the human immunodeficiency virus (HIV) or AIDS -low blood platelet counts -multiple sclerosis -an unusual or allergic reaction to influenza virus vaccine, other medicines, foods, dyes or preservatives -pregnant or trying to get pregnant -breast-feeding How should I use this medicine? This vaccine is for injection into a muscle. It is given by a health care professional. A copy of Vaccine Information Statements will be given before each vaccination. Read this sheet carefully each time. The sheet may change frequently. Talk to your pediatrician regarding the use of this medicine in children. Special care may be needed. Overdosage: If you think you've taken too much of this medicine contact a poison control center or emergency room at once. Overdosage: If you think you have taken too much of this medicine contact a poison control center or emergency room at once. NOTE: This medicine is only for you. Do not share this medicine with others. What if I miss a dose? This does not apply. What may interact with this medicine? -chemotherapy or radiation therapy -medicines that lower your immune system like etanercept, anakinra, infliximab, and adalimumab -medicines that treat or prevent blood clots like  warfarin -phenytoin -steroid medicines like prednisone or cortisone -theophylline -vaccines This list may not describe all possible interactions. Give your health care provider a list of all the medicines, herbs, non-prescription drugs, or dietary supplements you use. Also tell them if you smoke, drink alcohol, or use illegal drugs. Some items may interact with your medicine. What should I watch for while using this medicine? Report any side effects that do not go away within 3 days to your doctor or health care professional. Call your health care provider if any unusual symptoms occur within 6 weeks of receiving this vaccine. You may still catch the flu, but the illness is not usually as bad. You cannot get the flu from the vaccine. The vaccine will not protect against colds or other illnesses that may cause fever. The vaccine is needed every year. What side effects may I notice from receiving this medicine? Side effects that you should report to your doctor or health care professional as soon as possible: -allergic reactions like skin rash, itching or hives, swelling of the face, lips, or tongue Side effects that usually do not require medical attention (Report these to your doctor or health care professional if they continue or are bothersome.): -fever -headache -muscle aches and pains -pain, tenderness, redness, or swelling at the injection site -tiredness This list may not describe all possible side effects. Call your doctor for medical advice about side effects. You may report side effects to FDA at 1-800-FDA-1088. Where should I keep my medicine? The vaccine will be given by a health care professional in a clinic, pharmacy, doctor's office, or other health care setting. You will not be given vaccine doses to store at home. NOTE: This sheet is a summary.   It may not cover all possible information. If you have questions about this medicine, talk to your doctor, pharmacist, or health care  provider.  2018 Elsevier/Gold Standard (2011-04-11 14:06:47) First Trimester of Pregnancy The first trimester of pregnancy is from week 1 until the end of week 13 (months 1 through 3). A week after a sperm fertilizes an egg, the egg will implant on the wall of the uterus. This embryo will begin to develop into a baby. Genes from you and your partner will form the baby. The female genes will determine whether the baby will be a boy or a girl. At 6-8 weeks, the eyes and face will be formed, and the heartbeat can be seen on ultrasound. At the end of 12 weeks, all the baby's organs will be formed. Now that you are pregnant, you will want to do everything you can to have a healthy baby. Two of the most important things are to get good prenatal care and to follow your health care provider's instructions. Prenatal care is all the medical care you receive before the baby's birth. This care will help prevent, find, and treat any problems during the pregnancy and childbirth. Body changes during your first trimester Your body goes through many changes during pregnancy. The changes vary from woman to woman.  You may gain or lose a couple of pounds at first.  You may feel sick to your stomach (nauseous) and you may throw up (vomit). If the vomiting is uncontrollable, call your health care provider.  You may tire easily.  You may develop headaches that can be relieved by medicines. All medicines should be approved by your health care provider.  You may urinate more often. Painful urination may mean you have a bladder infection.  You may develop heartburn as a result of your pregnancy.  You may develop constipation because certain hormones are causing the muscles that push stool through your intestines to slow down.  You may develop hemorrhoids or swollen veins (varicose veins).  Your breasts may begin to grow larger and become tender. Your nipples may stick out more, and the tissue that surrounds them  (areola) may become darker.  Your gums may bleed and may be sensitive to brushing and flossing.  Dark spots or blotches (chloasma, mask of pregnancy) may develop on your face. This will likely fade after the baby is born.  Your menstrual periods will stop.  You may have a loss of appetite.  You may develop cravings for certain kinds of food.  You may have changes in your emotions from day to day, such as being excited to be pregnant or being concerned that something may go wrong with the pregnancy and baby.  You may have more vivid and strange dreams.  You may have changes in your hair. These can include thickening of your hair, rapid growth, and changes in texture. Some women also have hair loss during or after pregnancy, or hair that feels dry or thin. Your hair will most likely return to normal after your baby is born.  What to expect at prenatal visits During a routine prenatal visit:  You will be weighed to make sure you and the baby are growing normally.  Your blood pressure will be taken.  Your abdomen will be measured to track your baby's growth.  The fetal heartbeat will be listened to between weeks 10 and 14 of your pregnancy.  Test results from any previous visits will be discussed.  Your health care provider may  ask you:  How you are feeling.  If you are feeling the baby move.  If you have had any abnormal symptoms, such as leaking fluid, bleeding, severe headaches, or abdominal cramping.  If you are using any tobacco products, including cigarettes, chewing tobacco, and electronic cigarettes.  If you have any questions.  Other tests that may be performed during your first trimester include:  Blood tests to find your blood type and to check for the presence of any previous infections. The tests will also be used to check for low iron levels (anemia) and protein on red blood cells (Rh antibodies). Depending on your risk factors, or if you previously had diabetes  during pregnancy, you may have tests to check for high blood sugar that affects pregnant women (gestational diabetes).  Urine tests to check for infections, diabetes, or protein in the urine.  An ultrasound to confirm the proper growth and development of the baby.  Fetal screens for spinal cord problems (spina bifida) and Down syndrome.  HIV (human immunodeficiency virus) testing. Routine prenatal testing includes screening for HIV, unless you choose not to have this test.  You may need other tests to make sure you and the baby are doing well.  Follow these instructions at home: Medicines  Follow your health care provider's instructions regarding medicine use. Specific medicines may be either safe or unsafe to take during pregnancy.  Take a prenatal vitamin that contains at least 600 micrograms (mcg) of folic acid.  If you develop constipation, try taking a stool softener if your health care provider approves. Eating and drinking  Eat a balanced diet that includes fresh fruits and vegetables, whole grains, good sources of protein such as meat, eggs, or tofu, and low-fat dairy. Your health care provider will help you determine the amount of weight gain that is right for you.  Avoid raw meat and uncooked cheese. These carry germs that can cause birth defects in the baby.  Eating four or five small meals rather than three large meals a day may help relieve nausea and vomiting. If you start to feel nauseous, eating a few soda crackers can be helpful. Drinking liquids between meals, instead of during meals, also seems to help ease nausea and vomiting.  Limit foods that are high in fat and processed sugars, such as fried and sweet foods.  To prevent constipation: ? Eat foods that are high in fiber, such as fresh fruits and vegetables, whole grains, and beans. ? Drink enough fluid to keep your urine clear or pale yellow. Activity  Exercise only as directed by your health care provider.  Most women can continue their usual exercise routine during pregnancy. Try to exercise for 30 minutes at least 5 days a week. Exercising will help you: ? Control your weight. ? Stay in shape. ? Be prepared for labor and delivery.  Experiencing pain or cramping in the lower abdomen or lower back is a good sign that you should stop exercising. Check with your health care provider before continuing with normal exercises.  Try to avoid standing for long periods of time. Move your legs often if you must stand in one place for a long time.  Avoid heavy lifting.  Wear low-heeled shoes and practice good posture.  You may continue to have sex unless your health care provider tells you not to. Relieving pain and discomfort  Wear a good support bra to relieve breast tenderness.  Take warm sitz baths to soothe any pain or discomfort caused  by hemorrhoids. Use hemorrhoid cream if your health care provider approves.  Rest with your legs elevated if you have leg cramps or low back pain.  If you develop varicose veins in your legs, wear support hose. Elevate your feet for 15 minutes, 3-4 times a day. Limit salt in your diet. Prenatal care  Schedule your prenatal visits by the twelfth week of pregnancy. They are usually scheduled monthly at first, then more often in the last 2 months before delivery.  Write down your questions. Take them to your prenatal visits.  Keep all your prenatal visits as told by your health care provider. This is important. Safety  Wear your seat belt at all times when driving.  Make a list of emergency phone numbers, including numbers for family, friends, the hospital, and police and fire departments. General instructions  Ask your health care provider for a referral to a local prenatal education class. Begin classes no later than the beginning of month 6 of your pregnancy.  Ask for help if you have counseling or nutritional needs during pregnancy. Your health care  provider can offer advice or refer you to specialists for help with various needs.  Do not use hot tubs, steam rooms, or saunas.  Do not douche or use tampons or scented sanitary pads.  Do not cross your legs for long periods of time.  Avoid cat litter boxes and soil used by cats. These carry germs that can cause birth defects in the baby and possibly loss of the fetus by miscarriage or stillbirth.  Avoid all smoking, herbs, alcohol, and medicines not prescribed by your health care provider. Chemicals in these products affect the formation and growth of the baby.  Do not use any products that contain nicotine or tobacco, such as cigarettes and e-cigarettes. If you need help quitting, ask your health care provider. You may receive counseling support and other resources to help you quit.  Schedule a dentist appointment. At home, brush your teeth with a soft toothbrush and be gentle when you floss. Contact a health care provider if:  You have dizziness.  You have mild pelvic cramps, pelvic pressure, or nagging pain in the abdominal area.  You have persistent nausea, vomiting, or diarrhea.  You have a bad smelling vaginal discharge.  You have pain when you urinate.  You notice increased swelling in your face, hands, legs, or ankles.  You are exposed to fifth disease or chickenpox.  You are exposed to [place] measles (rubella) and have never had it. Get help right away if:  You have a fever.  You are leaking fluid from your vagina.  You have spotting or bleeding from your vagina.  You have severe abdominal cramping or pain.  You have rapid weight gain or loss.  You vomit blood or material that looks like coffee grounds.  You develop a severe headache.  You have shortness of breath.  You have any kind of trauma, such as from a fall or a car accident. Summary  The first trimester of pregnancy is from week 1 until the end of week 13 (months 1 through 3).  Your body goes  through many changes during pregnancy. The changes vary from woman to woman.  You will have routine prenatal visits. During those visits, your health care provider will examine you, discuss any test results you may have, and talk with you about how you are feeling. This information is not intended to replace advice given to you by your health care  provider. Make sure you discuss any questions you have with your health care provider. Document Released: 04/24/2001 Document Revised: 04/11/2016 Document Reviewed: 04/11/2016 Elsevier Interactive Patient Education  2017 [employer].

## 2017-02-05 NOTE — Progress Notes (Signed)
Last pap 03-14-15. Patient states she is having some nausea everyday. Patient also complaining of Bartholins gland inflamed. Bedside ultrasound performed today. Singleton pregnancy. CRL:2.72 cm consistent with 9 weeks 3 days.     FHR:180 bpm Patient LMP 11-24-16 dates her at 10 weeks 3 days.    EDD based off early ultrasound 09-07-17. Armandina Stammer RNBSN

## 2017-02-06 LAB — GC/CHLAMYDIA PROBE AMP (~~LOC~~) NOT AT ARMC
Chlamydia: NEGATIVE
Neisseria Gonorrhea: NEGATIVE

## 2017-02-06 LAB — OBSTETRIC PANEL, INCLUDING HIV
Antibody Screen: NEGATIVE
Basophils Absolute: 0 10*3/uL (ref 0.0–0.2)
Basos: 0 %
EOS (ABSOLUTE): 0.1 10*3/uL (ref 0.0–0.4)
EOS: 1 %
HEMOGLOBIN: 11.6 g/dL (ref 11.1–15.9)
HIV SCREEN 4TH GENERATION: NONREACTIVE
Hematocrit: 35.3 % (ref 34.0–46.6)
Hepatitis B Surface Ag: NEGATIVE
IMMATURE GRANULOCYTES: 0 %
Immature Grans (Abs): 0 10*3/uL (ref 0.0–0.1)
LYMPHS ABS: 1.6 10*3/uL (ref 0.7–3.1)
Lymphs: 23 %
MCH: 26.5 pg — AB (ref 26.6–33.0)
MCHC: 32.9 g/dL (ref 31.5–35.7)
MCV: 81 fL (ref 79–97)
MONOS ABS: 0.4 10*3/uL (ref 0.1–0.9)
Monocytes: 6 %
NEUTROS PCT: 70 %
Neutrophils Absolute: 4.8 10*3/uL (ref 1.4–7.0)
Platelets: 300 10*3/uL (ref 150–379)
RBC: 4.38 x10E6/uL (ref 3.77–5.28)
RDW: 15.5 % — ABNORMAL HIGH (ref 12.3–15.4)
RH TYPE: POSITIVE
RPR Ser Ql: NONREACTIVE
Rubella Antibodies, IGG: 1.57 index (ref >0.99)
WBC: 7 10*3/uL (ref 3.4–10.8)

## 2017-02-08 LAB — CULTURE, URINE COMPREHENSIVE

## 2017-02-09 ENCOUNTER — Other Ambulatory Visit: Payer: Self-pay | Admitting: Advanced Practice Midwife

## 2017-02-09 DIAGNOSIS — R8271 Bacteriuria: Secondary | ICD-10-CM | POA: Insufficient documentation

## 2017-02-09 MED ORDER — PENICILLIN V POTASSIUM 500 MG PO TABS
500.0000 mg | ORAL_TABLET | Freq: Four times a day (QID) | ORAL | 0 refills | Status: DC
Start: 2017-02-09 — End: 2017-02-25

## 2017-02-09 NOTE — Progress Notes (Signed)
GBS urine Rx Pcn  Treat in labor

## 2017-02-11 ENCOUNTER — Telehealth: Payer: Self-pay

## 2017-02-11 NOTE — Telephone Encounter (Signed)
-----   Message from Aviva Signs, CNM sent at 02/09/2017 10:00 PM EDT ----- Regarding: GBS urine I put in order for penicillin GBS urine  Can you call her?  tyhanks [facility]

## 2017-02-11 NOTE — Telephone Encounter (Signed)
Patient made aware that she has GBS in urine and she will need to take an antibiotic for this. Patient states understanding and made aware it is at her pharmacy for pick up. Armandina Stammer RNBSN

## 2017-02-12 ENCOUNTER — Inpatient Hospital Stay (HOSPITAL_COMMUNITY): Admission: RE | Admit: 2017-02-12 | Payer: BLUE CROSS/BLUE SHIELD | Source: Ambulatory Visit

## 2017-02-13 ENCOUNTER — Encounter (HOSPITAL_COMMUNITY): Payer: Self-pay | Admitting: Advanced Practice Midwife

## 2017-02-21 ENCOUNTER — Ambulatory Visit (HOSPITAL_COMMUNITY)
Admission: RE | Admit: 2017-02-21 | Discharge: 2017-02-21 | Disposition: A | Discharge status: Home / Self Care | Payer: BLUE CROSS/BLUE SHIELD | Source: Ambulatory Visit | Attending: Advanced Practice Midwife | Admitting: Advanced Practice Midwife

## 2017-02-21 ENCOUNTER — Encounter (HOSPITAL_COMMUNITY): Payer: Self-pay

## 2017-02-21 DIAGNOSIS — Z3480 Encounter for supervision of other normal pregnancy, unspecified trimester: Secondary | ICD-10-CM

## 2017-02-21 DIAGNOSIS — O99211 Obesity complicating pregnancy, first trimester: Secondary | ICD-10-CM | POA: Insufficient documentation

## 2017-02-21 DIAGNOSIS — O09891 Supervision of other high risk pregnancies, first trimester: Secondary | ICD-10-CM | POA: Insufficient documentation

## 2017-02-21 DIAGNOSIS — E669 Obesity, unspecified: Secondary | ICD-10-CM | POA: Diagnosis not present

## 2017-02-21 DIAGNOSIS — Z3A11 11 weeks gestation of pregnancy: Secondary | ICD-10-CM | POA: Insufficient documentation

## 2017-02-21 DIAGNOSIS — Z3682 Encounter for antenatal screening for nuchal translucency: Secondary | ICD-10-CM | POA: Insufficient documentation

## 2017-02-25 ENCOUNTER — Ambulatory Visit (INDEPENDENT_AMBULATORY_CARE_PROVIDER_SITE_OTHER): Payer: BLUE CROSS/BLUE SHIELD | Admitting: Obstetrics & Gynecology

## 2017-02-25 VITALS — BP 116/77 | HR 75 | Wt 224.0 lb

## 2017-02-25 DIAGNOSIS — R8271 Bacteriuria: Secondary | ICD-10-CM

## 2017-02-25 DIAGNOSIS — O99210 Obesity complicating pregnancy, unspecified trimester: Secondary | ICD-10-CM

## 2017-02-25 DIAGNOSIS — Z3480 Encounter for supervision of other normal pregnancy, unspecified trimester: Secondary | ICD-10-CM

## 2017-02-25 NOTE — Progress Notes (Signed)
   PRENATAL VISIT NOTE  Subjective:  Julia Acosta is a 31 y.o. G2P1001 at [redacted]w[redacted]d being seen today for ongoing prenatal care.  She is currently monitored for the following issues for this low-risk pregnancy and has Supervision of other normal pregnancy, antepartum; GBS bacteriuria; and Obesity affecting pregnancy, antepartum on her problem list.  Patient reports Mild N/V.  Contractions: Not present. Vag. Bleeding: None.   . Denies leaking of fluid.   The following portions of the patient's history were reviewed and updated as appropriate: allergies, current medications, past family history, past medical history, past social history, past surgical history and problem list. Problem list updated.  Objective:   Vitals:   02/25/17 0927  BP: 116/77  Pulse: 75  Weight: 224 lb (101.6 kg)    Fetal Status:           General:  Alert, oriented and cooperative. Patient is in no acute distress.  Skin: Skin is warm and dry. No rash noted.   Cardiovascular: Normal heart rate noted  Respiratory: Normal respiratory effort, no problems with respiration noted  Abdomen: Soft, gravid, appropriate for gestational age.  Pain/Pressure: Absent     Pelvic: Cervical exam deferred        Extremities: Normal range of motion.     Mental Status:  Normal mood and affect. Normal behavior. Normal judgment and thought content.   Assessment and Plan:  Pregnancy: G2P1001 at [redacted]w[redacted]d  1. Supervision of other normal pregnancy, antepartum   2. GBS bacteriuria Needs Atbx in labor.  3. Obesity affecting pregnancy, antepartum  Pt unable to complete the 2 hour GTT at present due to child care issues. Is willing to do it if her HgbA1c is elevated - Hemoglobin A1c today  Preterm labor symptoms and general obstetric precautions including but not limited to vaginal bleeding, contractions, leaking of fluid and fetal movement were reviewed in detail with the patient. Please refer to After Visit Summary for other counseling  recommendations.  Return in about 8 weeks (around 04/22/2017).   Willodean Rosenthal, MD

## 2017-02-25 NOTE — Patient Instructions (Signed)

## 2017-02-26 LAB — HEMOGLOBIN A1C
ESTIMATED AVERAGE GLUCOSE: 97 mg/dL
Hgb A1c MFr Bld: 5 % (ref 4.8–5.6)

## 2017-03-01 ENCOUNTER — Other Ambulatory Visit (HOSPITAL_COMMUNITY): Payer: Self-pay

## 2017-04-09 ENCOUNTER — Ambulatory Visit (HOSPITAL_COMMUNITY)
Admission: RE | Admit: 2017-04-09 | Discharge: 2017-04-09 | Disposition: A | Discharge status: Home / Self Care | Payer: BLUE CROSS/BLUE SHIELD | Source: Ambulatory Visit | Attending: Advanced Practice Midwife | Admitting: Advanced Practice Midwife

## 2017-04-09 ENCOUNTER — Other Ambulatory Visit (HOSPITAL_COMMUNITY): Payer: BLUE CROSS/BLUE SHIELD

## 2017-04-09 ENCOUNTER — Other Ambulatory Visit: Payer: Self-pay | Admitting: Advanced Practice Midwife

## 2017-04-09 DIAGNOSIS — Z3480 Encounter for supervision of other normal pregnancy, unspecified trimester: Secondary | ICD-10-CM

## 2017-04-09 DIAGNOSIS — O99212 Obesity complicating pregnancy, second trimester: Secondary | ICD-10-CM

## 2017-04-09 DIAGNOSIS — Z3A18 18 weeks gestation of pregnancy: Secondary | ICD-10-CM | POA: Diagnosis not present

## 2017-04-09 DIAGNOSIS — Z363 Encounter for antenatal screening for malformations: Secondary | ICD-10-CM | POA: Diagnosis not present

## 2017-04-09 DIAGNOSIS — Z369 Encounter for antenatal screening, unspecified: Secondary | ICD-10-CM

## 2017-04-22 ENCOUNTER — Encounter: Payer: BLUE CROSS/BLUE SHIELD | Admitting: Obstetrics & Gynecology

## 2017-04-23 ENCOUNTER — Encounter: Payer: BLUE CROSS/BLUE SHIELD | Admitting: Advanced Practice Midwife

## 2017-04-25 ENCOUNTER — Ambulatory Visit (INDEPENDENT_AMBULATORY_CARE_PROVIDER_SITE_OTHER): Payer: BLUE CROSS/BLUE SHIELD | Admitting: Obstetrics & Gynecology

## 2017-04-25 VITALS — BP 117/68 | HR 83 | Wt 224.0 lb

## 2017-04-25 DIAGNOSIS — IMO0002 Reserved for concepts with insufficient information to code with codable children: Secondary | ICD-10-CM

## 2017-04-25 DIAGNOSIS — Z0489 Encounter for examination and observation for other specified reasons: Secondary | ICD-10-CM

## 2017-04-25 DIAGNOSIS — Z3480 Encounter for supervision of other normal pregnancy, unspecified trimester: Secondary | ICD-10-CM

## 2017-04-25 NOTE — Progress Notes (Signed)
PRENATAL VISIT NOTE  Subjective:  Julia CornMaryyam Clagg is a 31 y.o. G2P1001 at 7632w5d being seen today for ongoing prenatal care.  She is currently monitored for the following issues for this low-risk pregnancy and has Supervision of other normal pregnancy, antepartum; GBS bacteriuria; and Obesity affecting pregnancy, antepartum on their problem list.  Patient reports no complaints.  Contractions: Not present. Vag. Bleeding: None.  Movement: Present. Denies leaking of fluid.   The following portions of the patient's history were reviewed and updated as appropriate: allergies, current medications, past family history, past medical history, past social history, past surgical history and problem list. Problem list updated.  Objective:   Vitals:   04/25/17 0921  BP: 117/68  Pulse: 83  Weight: 224 lb (101.6 kg)    Fetal Status: Fetal Heart Rate (bpm): 150   Movement: Present     General:  Alert, oriented and cooperative. Patient is in no acute distress.  Skin: Skin is warm and dry. No rash noted.   Cardiovascular: Normal heart rate noted  Respiratory: Normal respiratory effort, no problems with respiration noted  Abdomen: Soft, gravid, appropriate for gestational age.  Pain/Pressure: Absent     Pelvic: Cervical exam deferred        Extremities: Normal range of motion.     Mental Status:  Normal mood and affect. Normal behavior. Normal judgment and thought content.   [place] Mfm Ob Detail +14 Wk  Result Date: 04/09/2017 ----------------------------------------------------------------------  OBSTETRICS REPORT                      (Signed Final 04/09/2017 10:50 am) ---------------------------------------------------------------------- Patient Info  ID #:       [id number]                          D.O.B.:  11/11/1985 (31 yrs)  Name:       Julia Acosta                  Visit Date: 04/09/2017 07:53 am ---------------------------------------------------------------------- Performed By  Performed By:      Tommi Emerylivia Johnson         Ref. Address:     [address]                    [facility]                                                             RD                                                             [place]                                                             [phone]  Attending:        Charlsie MerlesMark Newman MD  [facility]  Referred By:      Aviva Signs CNM ---------------------------------------------------------------------- Orders   #  Description                                 Code   1  [place] MFM OB DETAIL +14 WK                     76811.01  ----------------------------------------------------------------------   #  Ordered By               Order #        Accession #    Episode #   1  Wynelle Bourgeois           [id number]      [id number]     [id number]  ---------------------------------------------------------------------- Indications   [redacted] weeks gestation of pregnancy                Z3A.18   Encounter for antenatal screening for          Z36.3   malformations   Obesity complicating pregnancy, second         O99.212   trimester  ---------------------------------------------------------------------- OB History  Blood Type:            Height:  5'3"   Weight (lb):  220       BMI:  38.97  Gravidity:    2         Term:   1  Living:       1 ---------------------------------------------------------------------- Fetal Evaluation  Num Of Fetuses:     1  Fetal Heart         153  Rate(bpm):  Cardiac Activity:   Observed  Presentation:       Breech  Placenta:           Anterior, above cervical os  P. Cord Insertion:  Visualized  Amniotic Fluid  AFI FV:      Subjectively within normal limits                              Largest Pocket(cm)                              4.46 ---------------------------------------------------------------------- Biometry  BPD:      42.8  mm     G. Age:  19w 0d         73  %    CI:        84.73   %    70 - 86                                                           FL/HC:      19.1   %    15.8 - 18  HC:      146.5  mm     G. Age:  17w 6d         16  %  HC/AC:      1.13        1.07 - 1.29  AC:      130.2  mm     G. Age:  18w 4d         51  %    FL/BPD:     65.4   %  FL:         28  mm     G. Age:  18w 4d         50  %    FL/AC:      21.5   %    20 - 24  HUM:      29.1  mm     G. Age:  19w 3d         82  %  CER:        21  mm     G. Age:  20w 0d         88  %  CM:          4  mm  Est. FW:     243  gm      0 lb 9 oz     48  % ---------------------------------------------------------------------- Gestational Age  LMP:           19w 3d        Date:  11/24/16                 EDD:   08/31/17  U/S Today:     18w 4d                                        EDD:   09/06/17  Best:          18w 3d     Det. By:  Marcella Dubs         EDD:   09/07/17                                      (02/05/17) ---------------------------------------------------------------------- Anatomy  Cranium:               Appears normal         Aortic Arch:            Appears normal  Cavum:                 Appears normal         Ductal Arch:            Appears normal  Ventricles:            Appears normal         Diaphragm:              Appears normal  Choroid Plexus:        Appears normal         Stomach:                Appears normal, left  sided  Cerebellum:            Appears normal         Abdomen:                Appears normal  Posterior Fossa:       Appears normal         Abdominal Wall:         Appears nml (cord                                                                        insert, abd wall)  Nuchal Fold:           Appears normal         Cord Vessels:           Appears normal (3                                                                        vessel cord)  Face:                  Appears normal         Kidneys:                Appear normal                         (orbits and profile)  Lips:                   Appears normal         Bladder:                Appears normal  Thoracic:              Appears normal         Spine:                  Not well visualized  Heart:                 Appears normal         Upper Extremities:      Appears normal                         (4CH, axis, and situs  RVOT:                  Appears normal         Lower Extremities:      Appears normal  LVOT:                  Appears normal  Other:  Fetus appears to be a female. Heels visualized. Technically difficult          due to maternal habitus and fetal position. ---------------------------------------------------------------------- Cervix Uterus Adnexa  Cervix  Length:           3.87  cm.  Normal appearance by transabdominal scan.  Uterus  No abnormality visualized.  Left Ovary  Within normal limits.  Right Ovary  Not visualized.  Cul De Sac:   No free fluid seen.  Adnexa:       No abnormality visualized. ---------------------------------------------------------------------- Impression  Singleton intrauterine pregnancy at 18+3 weeks, here for  anatomic survey  Review of the anatomy shows no sonographic markers for  aneuploidy or structural anomalies  However, the spinal evaluation should be considered  suboptimal secondary to maternal body habitus and fetal  position  Amniotic fluid volume is normal  Estimated fetal weight is 243g which is growth in the 48th  percentile ----------------------------------------------------------------------                 Charlsie MerlesMark Newman, MD Electronically Signed Final Report   04/09/2017 10:50 am ----------------------------------------------------------------------   Assessment and Plan:  Pregnancy: G2P1001 at 3667w5d  1. Evaluate anatomy not seen on prior sonogram Follow up anatomy scan ordered. - US MFM OB FOLLOW UP; Future  2. Supervision of other normal pregnancy, antepartum Normal first trimester screen. - AFP, Serum, Open Spina Bifida Doing well on Babyscripts.  Preterm labor symptoms  and general obstetric precautions including but not limited to vaginal bleeding, contractions, leaking of fluid and fetal movement were reviewed in detail with the patient. Please refer to After Visit Summary for other counseling recommendations.  Return in about 8 weeks (around 06/20/2017) for 2 hr GTT, TDap, 3rd trimester labs, OB 28 week visit (Babyscripts).   Jaynie CollinsUgonna Anyanwu, MD

## 2017-04-25 NOTE — Patient Instructions (Signed)
Third Trimester of Pregnancy The third trimester is from week 28 through week 40 (months 7 through 9). The third trimester is a time when the unborn baby (fetus) is growing rapidly. At the end of the ninth month, the fetus is about 20 inches in length and weighs 6-10 pounds. Body changes during your third trimester Your body will continue to go through many changes during pregnancy. The changes vary from woman to woman. During the third trimester:  Your weight will continue to increase. You can expect to gain 25-35 pounds (11-16 kg) by the end of the pregnancy.  You may begin to get stretch marks on your hips, abdomen, and breasts.  You may urinate more often because the fetus is moving lower into your pelvis and pressing on your bladder.  You may develop or continue to have heartburn. This is caused by increased hormones that slow down muscles in the digestive tract.  You may develop or continue to have constipation because increased hormones slow digestion and cause the muscles that push waste through your intestines to relax.  You may develop hemorrhoids. These are swollen veins (varicose veins) in the rectum that can itch or be painful.  You may develop swollen, bulging veins (varicose veins) in your legs.  You may have increased body aches in the pelvis, back, or thighs. This is due to weight gain and increased hormones that are relaxing your joints.  You may have changes in your hair. These can include thickening of your hair, rapid growth, and changes in texture. Some women also have hair loss during or after pregnancy, or hair that feels dry or thin. Your hair will most likely return to normal after your baby is born.  Your breasts will continue to grow and they will continue to become tender. A yellow fluid (colostrum) may leak from your breasts. This is the first milk you are producing for your baby.  Your belly button may stick out.  You may notice more swelling in your hands,  face, or ankles.  You may have increased tingling or numbness in your hands, arms, and legs. The skin on your belly may also feel numb.  You may feel short of breath because of your expanding uterus.  You may have more problems sleeping. This can be caused by the size of your belly, increased need to urinate, and an increase in your body's metabolism.  You may notice the fetus "dropping," or moving lower in your abdomen (lightening).  You may have increased vaginal discharge.  You may notice your joints feel loose and you may have pain around your pelvic bone.  What to expect at prenatal visits You will have prenatal exams every 2 weeks until week 36. Then you will have weekly prenatal exams. During a routine prenatal visit:  You will be weighed to make sure you and the baby are growing normally.  Your blood pressure will be taken.  Your abdomen will be measured to track your baby's growth.  The fetal heartbeat will be listened to.  Any test results from the previous visit will be discussed.  You may have a cervical check near your due date to see if your cervix has softened or thinned (effaced).  You will be tested for Group B streptococcus. This happens between 35 and 37 weeks.  Your health care provider may ask you:  What your birth plan is.  How you are feeling.  If you are feeling the baby move.  If you have had   any abnormal symptoms, such as leaking fluid, bleeding, severe headaches, or abdominal cramping.  If you are using any tobacco products, including cigarettes, chewing tobacco, and electronic cigarettes.  If you have any questions.  Other tests or screenings that may be performed during your third trimester include:  Blood tests that check for low iron levels (anemia).  Fetal testing to check the health, activity level, and growth of the fetus. Testing is done if you have certain medical conditions or if there are problems during the  pregnancy.  Nonstress test (NST). This test checks the health of your baby to make sure there are no signs of problems, such as the baby not getting enough oxygen. During this test, a belt is placed around your belly. The baby is made to move, and its heart rate is monitored during movement.  What is false labor? False labor is a condition in which you feel small, irregular tightenings of the muscles in the womb (contractions) that usually go away with rest, changing position, or drinking water. These are called Braxton Hicks contractions. Contractions may last for hours, days, or even weeks before true labor sets in. If contractions come at regular intervals, become more frequent, increase in intensity, or become painful, you should see your health care provider. What are the signs of labor?  Abdominal cramps.  Regular contractions that start at 10 minutes apart and become stronger and more frequent with time.  Contractions that start on the top of the uterus and spread down to the lower abdomen and back.  Increased pelvic pressure and dull back pain.  A watery or bloody mucus discharge that comes from the vagina.  Leaking of amniotic fluid. This is also known as your "water breaking." It could be a slow trickle or a gush. Let your health care provider know if it has a color or strange odor. If you have any of these signs, call your health care provider right away, even if it is before your due date. Follow these instructions at home: Medicines  Follow your health care provider's instructions regarding medicine use. Specific medicines may be either safe or unsafe to take during pregnancy.  Take a prenatal vitamin that contains at least 600 micrograms (mcg) of folic acid.  If you develop constipation, try taking a stool softener if your health care provider approves. Eating and drinking  Eat a balanced diet that includes fresh fruits and vegetables, whole grains, good sources of protein  such as meat, eggs, or tofu, and low-fat dairy. Your health care provider will help you determine the amount of weight gain that is right for you.  Avoid raw meat and uncooked cheese. These carry germs that can cause birth defects in the baby.  If you have low calcium intake from food, talk to your health care provider about whether you should take a daily calcium supplement.  Eat four or five small meals rather than three large meals a day.  Limit foods that are high in fat and processed sugars, such as fried and sweet foods.  To prevent constipation: ? Drink enough fluid to keep your urine clear or pale yellow. ? Eat foods that are high in fiber, such as fresh fruits and vegetables, whole grains, and beans. Activity  Exercise only as directed by your health care provider. Most women can continue their usual exercise routine during pregnancy. Try to exercise for 30 minutes at least 5 days a week. Stop exercising if you experience uterine contractions.  Avoid heavy   lifting.  Do not exercise in extreme heat or humidity, or at high altitudes.  Wear low-heel, comfortable shoes.  Practice good posture.  You may continue to have sex unless your health care provider tells you otherwise. Relieving pain and discomfort  Take frequent breaks and rest with your legs elevated if you have leg cramps or low back pain.  Take warm sitz baths to soothe any pain or discomfort caused by hemorrhoids. Use hemorrhoid cream if your health care provider approves.  Wear a good support bra to prevent discomfort from breast tenderness.  If you develop varicose veins: ? Wear support pantyhose or compression stockings as told by your healthcare provider. ? Elevate your feet for 15 minutes, 3-4 times a day. Prenatal care  Write down your questions. Take them to your prenatal visits.  Keep all your prenatal visits as told by your health care provider. This is important. Safety  Wear your seat belt at  all times when driving.  Make a list of emergency phone numbers, including numbers for family, friends, the hospital, and police and fire departments. General instructions  Avoid cat litter boxes and soil used by cats. These carry germs that can cause birth defects in the baby. If you have a cat, ask someone to clean the litter box for you.  Do not travel far distances unless it is absolutely necessary and only with the approval of your health care provider.  Do not use hot tubs, steam rooms, or saunas.  Do not drink alcohol.  Do not use any products that contain nicotine or tobacco, such as cigarettes and e-cigarettes. If you need help quitting, ask your health care provider.  Do not use any medicinal herbs or unprescribed drugs. These chemicals affect the formation and growth of the baby.  Do not douche or use tampons or scented sanitary pads.  Do not cross your legs for long periods of time.  To prepare for the arrival of your baby: ? Take prenatal classes to understand, practice, and ask questions about labor and delivery. ? Make a trial run to the hospital. ? Visit the hospital and tour the maternity area. ? Arrange for maternity or paternity leave through employers. ? Arrange for family and friends to take care of pets while you are in the hospital. ? Purchase a rear-facing car seat and make sure you know how to install it in your car. ? Pack your hospital bag. ? Prepare the baby's nursery. Make sure to remove all pillows and stuffed animals from the baby's crib to prevent suffocation.  Visit your dentist if you have not gone during your pregnancy. Use a soft toothbrush to brush your teeth and be gentle when you floss. Contact a health care provider if:  You are unsure if you are in labor or if your water has broken.  You become dizzy.  You have mild pelvic cramps, pelvic pressure, or nagging pain in your abdominal area.  You have lower back pain.  You have persistent  nausea, vomiting, or diarrhea.  You have an unusual or bad smelling vaginal discharge.  You have pain when you urinate. Get help right away if:  Your water breaks before 37 weeks.  You have regular contractions less than 5 minutes apart before 37 weeks.  You have a fever.  You are leaking fluid from your vagina.  You have spotting or bleeding from your vagina.  You have severe abdominal pain or cramping.  You have rapid weight loss or weight gain.    You have shortness of breath with chest pain.  You notice sudden or extreme swelling of your face, hands, ankles, feet, or legs.  Your baby makes fewer than 10 movements in 2 hours.  You have severe headaches that do not go away when you take medicine.  You have vision changes. Summary  The third trimester is from week 28 through week 40, months 7 through 9. The third trimester is a time when the unborn baby (fetus) is growing rapidly.  During the third trimester, your discomfort may increase as you and your baby continue to gain weight. You may have abdominal, leg, and back pain, sleeping problems, and an increased need to urinate.  During the third trimester your breasts will keep growing and they will continue to become tender. A yellow fluid (colostrum) may leak from your breasts. This is the first milk you are producing for your baby.  False labor is a condition in which you feel small, irregular tightenings of the muscles in the womb (contractions) that eventually go away. These are called Braxton Hicks contractions. Contractions may last for hours, days, or even weeks before true labor sets in.  Signs of labor can include: abdominal cramps; regular contractions that start at 10 minutes apart and become stronger and more frequent with time; watery or bloody mucus discharge that comes from the vagina; increased pelvic pressure and dull back pain; and leaking of amniotic fluid. This information is not intended to replace advice  given to you by your health care provider. Make sure you discuss any questions you have with your health care provider. Document Released: 04/24/2001 Document Revised: 10/06/2015 Document Reviewed: 07/01/2012 Elsevier Interactive Patient Education  2017 [employer]  

## 2017-04-28 LAB — AFP, SERUM, OPEN SPINA BIFIDA
AFP MOM: 1.3
AFP Value: 62.2 ng/mL
Gest. Age on Collection Date: 20.7 weeks
MATERNAL AGE AT EDD: 29.4 a
OSBR RISK 1 IN: 4803
Test Results:: NEGATIVE
Weight: 224 [lb_av]

## 2017-05-10 ENCOUNTER — Ambulatory Visit (HOSPITAL_COMMUNITY)
Admission: RE | Admit: 2017-05-10 | Discharge: 2017-05-10 | Disposition: A | Discharge status: Home / Self Care | Payer: BLUE CROSS/BLUE SHIELD | Source: Ambulatory Visit | Attending: Obstetrics & Gynecology | Admitting: Obstetrics & Gynecology

## 2017-05-10 ENCOUNTER — Other Ambulatory Visit: Payer: Self-pay | Admitting: Obstetrics & Gynecology

## 2017-05-10 DIAGNOSIS — E669 Obesity, unspecified: Secondary | ICD-10-CM | POA: Insufficient documentation

## 2017-05-10 DIAGNOSIS — Z3A22 22 weeks gestation of pregnancy: Secondary | ICD-10-CM | POA: Diagnosis not present

## 2017-05-10 DIAGNOSIS — Z0489 Encounter for examination and observation for other specified reasons: Secondary | ICD-10-CM

## 2017-05-10 DIAGNOSIS — O99212 Obesity complicating pregnancy, second trimester: Secondary | ICD-10-CM | POA: Insufficient documentation

## 2017-05-10 DIAGNOSIS — IMO0002 Reserved for concepts with insufficient information to code with codable children: Secondary | ICD-10-CM

## 2017-05-10 DIAGNOSIS — Z362 Encounter for other antenatal screening follow-up: Secondary | ICD-10-CM | POA: Diagnosis not present

## 2017-05-14 NOTE — L&D Delivery Note (Addendum)
Delivery Note Pt labored spontaneously and became complete at 0200. She pushed well and at 2:21 AM a viable female was delivered via Vaginal, Spontaneous (Presentation: ROA).  APGAR: 8, 9; weight: pending. Delivery was almost entirely en caul- membranes ruptured for clear fluid as head emerged from pt's vagina. Infant dried and lifted to pt's abd; cord clamped and cut by CNM after 1min. Hospital cord blood sample collected. Placenta status: spont, intact.  Cord: 3 vessels  Anesthesia:  1% lidocaine Episiotomy: None Lacerations: 2nd degree;Perineal Suture Repair: 3.0 monocryl Est. Blood Loss (mL): 350  Mom to postpartum.  Baby to Couplet care / Skin to Skin.  Cam HaiSHAW, Sanav Remer CNM 09/02/2017, 3:05 AM  Please schedule this patient for Postpartum visit in: 4 weeks with the following provider: Any provider For C/S patients schedule nurse incision check in weeks 2 weeks: no Low risk pregnancy complicated by: none Delivery mode:  SVD Anticipated Birth Control:  Condoms PP Procedures needed: none  Schedule Integrated BH visit: no

## 2017-05-30 ENCOUNTER — Encounter: Payer: BLUE CROSS/BLUE SHIELD | Admitting: Obstetrics & Gynecology

## 2017-06-18 ENCOUNTER — Ambulatory Visit (INDEPENDENT_AMBULATORY_CARE_PROVIDER_SITE_OTHER): Payer: BLUE CROSS/BLUE SHIELD | Admitting: Advanced Practice Midwife

## 2017-06-18 ENCOUNTER — Encounter: Payer: Self-pay | Admitting: Advanced Practice Midwife

## 2017-06-18 VITALS — BP 116/74 | HR 108 | Wt 234.0 lb

## 2017-06-18 DIAGNOSIS — Z3490 Encounter for supervision of normal pregnancy, unspecified, unspecified trimester: Secondary | ICD-10-CM

## 2017-06-18 DIAGNOSIS — Z23 Encounter for immunization: Secondary | ICD-10-CM | POA: Diagnosis not present

## 2017-06-18 DIAGNOSIS — Z3483 Encounter for supervision of other normal pregnancy, third trimester: Secondary | ICD-10-CM

## 2017-06-18 DIAGNOSIS — Z3480 Encounter for supervision of other normal pregnancy, unspecified trimester: Secondary | ICD-10-CM

## 2017-06-18 NOTE — Patient Instructions (Signed)

## 2017-06-18 NOTE — Progress Notes (Signed)
   PRENATAL VISIT NOTE   Subjective:  Julia Acosta is a 32 y.o. G2P1001 at 4562w3d being seen today for ongoing prenatal care.  She is currently monitored for the following issues for this low-risk pregnancy and has Supervision of other normal pregnancy, antepartum; GBS bacteriuria; and Obesity affecting pregnancy, antepartum on their problem list.  Patient reports hip pain that is worse w/ activity. .  Contractions: Not present. Vag. Bleeding: None.  Movement: Present. Denies leaking of fluid.   The following portions of the patient's history were reviewed and updated as appropriate: allergies, current medications, past family history, past medical history, past social history, past surgical history and problem list. Problem list updated.  Objective:   Vitals:   06/18/17 0824  BP: 116/74  Pulse: (!) 108  Weight: 234 lb (106.1 kg)    Fetal Status: Fetal Heart Rate (bpm): 135 Fundal Height: 29 cm Movement: Present  Presentation: Vertex  General:  Alert, oriented and cooperative. Patient is in no acute distress.  Skin: Skin is warm and dry. No rash noted.   Cardiovascular: Normal heart rate noted  Respiratory: Normal respiratory effort, no problems with respiration noted  Abdomen: Soft, gravid, appropriate for gestational age.  Pain/Pressure: Absent     Pelvic: Cervical exam deferred        Extremities: Normal range of motion.  Edema: None  Mental Status:  Normal mood and affect. Normal behavior. Normal judgment and thought content.   Assessment and Plan:  Pregnancy: G2P1001 at 5862w3d  1. Prenatal care, antepartum  - Glucose Tolerance, 2 Hours w/1 Hour - CBC - RPR - HIV antibody (with reflex)  2. MS hip pain  - Maternity belt  - Warm baths, compresses  - hip stretches.   Preterm labor symptoms and general obstetric precautions including but not limited to vaginal bleeding, contractions, leaking of fluid and fetal movement were reviewed in detail with the patient. Please  refer to After Visit Summary for other counseling recommendations.  Return in about 4 weeks (around 07/16/2017) for ROB.   Dorathy KinsmanVirginia Patra Gherardi, CNM

## 2017-06-19 LAB — CBC
Hematocrit: 31.1 % — ABNORMAL LOW (ref 34.0–46.6)
Hemoglobin: 10 g/dL — ABNORMAL LOW (ref 11.1–15.9)
MCH: 26.5 pg — ABNORMAL LOW (ref 26.6–33.0)
MCHC: 32.2 g/dL (ref 31.5–35.7)
MCV: 83 fL (ref 79–97)
PLATELETS: 257 10*3/uL (ref 150–379)
RBC: 3.77 x10E6/uL (ref 3.77–5.28)
RDW: 14.6 % (ref 12.3–15.4)
WBC: 7.4 10*3/uL (ref 3.4–10.8)

## 2017-06-19 LAB — GLUCOSE TOLERANCE, 2 HOURS W/ 1HR
GLUCOSE, 1 HOUR: 85 mg/dL (ref 65–179)
Glucose, 2 hour: 115 mg/dL (ref 65–152)
Glucose, Fasting: 76 mg/dL (ref 65–91)

## 2017-06-19 LAB — HIV ANTIBODY (ROUTINE TESTING W REFLEX): HIV Screen 4th Generation wRfx: NONREACTIVE

## 2017-06-19 LAB — RPR: RPR: NONREACTIVE

## 2017-07-16 ENCOUNTER — Ambulatory Visit (INDEPENDENT_AMBULATORY_CARE_PROVIDER_SITE_OTHER): Payer: BLUE CROSS/BLUE SHIELD | Admitting: Advanced Practice Midwife

## 2017-07-16 ENCOUNTER — Encounter: Payer: Self-pay | Admitting: Advanced Practice Midwife

## 2017-07-16 VITALS — BP 124/78 | HR 100 | Wt 242.0 lb

## 2017-07-16 DIAGNOSIS — Z3480 Encounter for supervision of other normal pregnancy, unspecified trimester: Secondary | ICD-10-CM

## 2017-07-16 NOTE — Progress Notes (Signed)
   PRENATAL VISIT NOTE  Subjective:  Julia Acosta is a 32 y.o. G2P1001 at 618w3d being seen today for ongoing prenatal care.  She is currently monitored for the following issues for this low-risk pregnancy and has Supervision of other normal pregnancy, antepartum; GBS bacteriuria; and Obesity affecting pregnancy, antepartum on their problem list.  Patient reports no complaints.  Contractions: Not present. Vag. Bleeding: None.  Movement: Present. Denies leaking of fluid.   The following portions of the patient's history were reviewed and updated as appropriate: allergies, current medications, past family history, past medical history, past social history, past surgical history and problem list. Problem list updated.  Objective:   Vitals:   07/16/17 0902  BP: 124/78  Pulse: 100  Weight: 242 lb (109.8 kg)    Fetal Status: Fetal Heart Rate (bpm): 140 Fundal Height: 32 cm Movement: Present     General:  Alert, oriented and cooperative. Patient is in no acute distress.  Skin: Skin is warm and dry. No rash noted.   Cardiovascular: Normal heart rate noted  Respiratory: Normal respiratory effort, no problems with respiration noted  Abdomen: Soft, gravid, appropriate for gestational age.  Pain/Pressure: Absent     Pelvic: Cervical exam deferred        Extremities: Normal range of motion.  Edema: None  Mental Status:  Normal mood and affect. Normal behavior. Normal judgment and thought content.   Assessment and Plan:  Pregnancy: G2P1001 at 7118w3d  1. Supervision of other normal pregnancy, antepartum - Routine care   Preterm labor symptoms and general obstetric precautions including but not limited to vaginal bleeding, contractions, leaking of fluid and fetal movement were reviewed in detail with the patient. Please refer to After Visit Summary for other counseling recommendations.  Return in about 4 weeks (around 08/13/2017). Baby scripts optimized schedule    Thressa ShellerHeather Hogan, CNM

## 2017-07-16 NOTE — Patient Instructions (Signed)
Childbirth Education Options: [facility] Classes:  Childbirth education classes can help you get ready for a positive parenting experience. You can also meet other expectant parents and get free stuff for your baby. Each class runs for five weeks on the same night and costs $45 for the mother-to-be and her support person. Medicaid covers the cost if you are eligible. Call [phone] to register. [facility] Childbirth Education:  [phone] or [phone] or [email or web address]  Baby & Me Class: Discuss newborn & infant parenting and family adjustment issues with other new mothers in a relaxed environment. Each week brings a new speaker or baby-centered activity. We encourage new mothers to join us every Thursday at 11:00am. Babies birth until crawling. No registration or fee. Daddy Boot Camp: This course offers Dads-to-be the tools and knowledge needed to feel confident on their journey to becoming new fathers. Experienced dads, who have been trained as coaches, teach dads-to-be how to hold, comfort, diaper, swaddle and play with their infant while being able to support the new mom as well. A class for men taught by men. $25/dad Big Brother/Big Sister: Let your children share in the joy of a new brother or sister in this special class designed just for them. Class includes discussion about how families care for babies: swaddling, holding, diapering, safety as well as how they can be helpful in their new role. This class is designed for children ages 2 to 6, but any age is welcome. Please register each child individually. $5/child  Mom Talk: This mom-led group offers support and connection to mothers as they journey through the adjustments and struggles of that sometimes overwhelming first year after the birth of a child. Tuesdays at 10:00am and Thursdays at 6:00pm. Babies welcome. No registration or fee. Breastfeeding Support Group: This group is a mother-to-mother  support circle where moms have the opportunity to share their breastfeeding experiences. A Lactation Consultant is present for questions and concerns. Meets each Tuesday at 11:00am. No fee or registration. Breastfeeding Your Baby: Learn what to expect in the first days of breastfeeding your newborn.  This class will help you feel more confident with the skills needed to begin your breastfeeding experience. Many new mothers are concerned about breastfeeding after leaving the hospital. This class will also address the most common fears and challenges about breastfeeding during the first few weeks, months and beyond. (call for fee) Comfort Techniques and Tour: This 2 hour interactive class will provide you the opportunity to learn & practice hands-on techniques that can help relieve some of the discomfort of labor and encourage your baby to rotate toward the best position for birth. You and your partner will be able to try a variety of labor positions with birth balls and rebozos as well as practice breathing, relaxation, and visualization techniques. A tour of the [facility] is included with this class. $20 per registrant and support person Childbirth Class- Weekend Option: This class is a Weekend version of our Birth & Baby series. It is designed for parents who have a difficult time fitting several weeks of classes into their schedule. It covers the care of your newborn and the basics of labor and childbirth. It also includes a Maternity Care Center Tour of [facility] and lunch. The class is held two consecutive days: beginning on Friday evening from 6:30 - 8:30 p.m. and the next day, Saturday from 9 a.m. - 4 p.m. (call for fee) Waterbirth Class: Interested in a waterbirth?  This   informational class will help you discover whether waterbirth is the right fit for you. Education about waterbirth itself, supplies you would need and how to assemble your support team is what you can  expect from this class. Some obstetrical practices require this class in order to pursue a waterbirth. (Not all obstetrical practices offer waterbirth-check with your healthcare provider.) Register only the expectant mom, but you are encouraged to bring your partner to class! Required if planning waterbirth, no fee. Infant/Child CPR: Parents, grandparents, babysitters, and friends learn Cardio-Pulmonary Resuscitation skills for infants and children. You will also learn how to treat both conscious and unconscious choking in infants and children. This Family & Friends program does not offer certification. Register each participant individually to ensure that enough mannequins are available. (Call for fee) Grandparent Love: Expecting a grandbaby? This class is for you! Learn about the latest infant care and safety recommendations and ways to support your own child as he or she transitions into the parenting role. Taught by Registered Nurses who are childbirth instructors, but most importantly...they are grandmothers too! $10/person. Childbirth Class- Natural Childbirth: This series of 5 weekly classes is for expectant parents who want to learn and practice natural methods of coping with the process of labor and childbirth. Relaxation, breathing, massage, visualization, role of the partner, and helpful positioning are highlighted. Participants learn how to be confident in their body's ability to give birth. This class will empower and help parents make informed decisions about their own care. Includes discussion that will help new parents transition into the immediate postpartum period. Maternity Care Center Tour of [facility] is included. We suggest taking this class between 25-32 weeks, but it's only a recommendation. $75 per registrant and one support person or $30 Medicaid. Childbirth Class- 3 week Series: This option of 3 weekly classes helps you and your labor partner prepare for childbirth. Newborn  care, labor & birth, cesarean birth, pain management, and comfort techniques are discussed and a Maternity Care Center Tour of [facility] is included. The class meets at the same time, on the same day of the week for 3 consecutive weeks beginning with the starting date you choose. $60 for registrant and one support person.  Marvelous Multiples: Expecting twins, triplets, or more? This class covers the differences in labor, birth, parenting, and breastfeeding issues that face multiples' parents. NICU tour is included. Led by a Certified Childbirth Educator who is the mother of twins. No fee. Caring for Baby: This class is for expectant and adoptive parents who want to learn and practice the most up-to-date newborn care for their babies. Focus is on birth through the first six weeks of life. Topics include feeding, bathing, diapering, crying, umbilical cord care, circumcision care and safe sleep. Parents learn to recognize symptoms of illness and when to call the pediatrician. Register only the mom-to-be and your partner or support person can plan to come with you! $10 per registrant and support person Childbirth Class- online option: This online class offers you the freedom to complete a Birth and Baby series in the comfort of your own home. The flexibility of this option allows you to review sections at your own pace, at times convenient to you and your support people. It includes additional video information, animations, quizzes, and extended activities. Get organized with helpful eClass tools, checklists, and trackers. Once you register online for the class, you will receive an email within a few days to accept the invitation and begin the class when the time   is right for you. The content will be available to you for 60 days. $60 for 60 days of online access for you and your support people.  Local Doulas: Natural Baby Doulas [email or web address] Tel:  [phone] [email or web address] [facility] [phone] [email or web address] The [employer]  (also do waterbirth tub rental) [phone] [email or web address] [email or web address] [phone] [email or web address] [employer]hyth[employer]  [phone] [email or web address] [place] [employer] (PADA) [email or web address].org/index.htm [place] [facility]  [email or web address] Considering Waterbirth? Guide for patients at [facility]  Why consider waterbirth?  . Gentle birth for babies . Less pain medicine used in labor . May allow for passive descent/less pushing . May reduce perineal tears  . More mobility and instinctive maternal position changes . Increased maternal relaxation . Reduced blood pressure in labor  Is waterbirth safe? What are the risks of infection, drowning or other complications?  . Infection: o Very low risk (3.7 % for tub vs 4.8% for bed) o 7 in 8000 waterbirths with documented infection o Poorly cleaned equipment most common cause o Slightly lower group B strep transmission rate  . Drowning o Maternal:  - Very low risk   - Related to seizures or fainting o Newborn:  - Very low risk. No evidence of increased risk of respiratory problems in multiple large studies - Physiological protection from breathing under water - Avoid underwater birth if there are any fetal complications - Once baby's head is out of the water, keep it out.  . Birth complication o Some reports of cord trauma, but risk decreased by bringing baby to surface gradually o No evidence of increased risk of shoulder dystocia. Mothers can usually change positions faster in water than in a bed, possibly aiding the maneuvers to free the shoulder.   You must attend a Waterbirth class at [facility]  3rd Wednesday of every month from 7-9pm  Free  Register by calling [phone] or online at [email or web address]  Bring us the certificate from the class to your prenatal appointment  Meet with a midwife at 36 weeks to see if you can still plan a waterbirth and to sign the consent.   Purchase or rent the following supplies:   Water Birth Pool (Birth Pool in a Box or [place] for instance)  (Tubs start ~$125)  Single-use disposable tub liner designed for your brand of tub  New garden hose labeled "lead-free", "suitable for drinking water",  Electric drain pump to remove water (We recommend 792 gallon per hour or greater pump.)   Separate garden hose to remove the dirty water  Fish net  Bathing suit top (optional)  Long-handled mirror (optional)  Places to purchase or rent supplies  Yourwaterbirth.com for tub purchases and supplies  Waterbirthsolutions.com for tub purchases and supplies  [employer] ([email or web address]) $275 for tub rental/set-up & take down/kit   [place] [employer] (http://www.padanc.org/MeetUs.htm) Information regarding doulas (labor support) who provide pool rentals  Our practice has a Birth Pool in a Box tub at the hospital that you may borrow on a first-come-first-served basis. It is your responsibility to to set up, clean and break down the tub. We cannot guarantee the availability of this tub in advance. You are responsible for bringing all accessories listed above. If you do not have all necessary supplies you cannot have a waterbirth.    Things that would prevent you from having a waterbirth:  Premature, <37wks  Previous cesarean birth  Presence of thick meconium-stained fluid  Multiple gestation (Twins,   triplets, etc.)  Uncontrolled diabetes or gestational diabetes requiring medication  Hypertension requiring medication or diagnosis of pre-eclampsia  Heavy vaginal bleeding  Non-reassuring fetal  heart rate  Active infection (MRSA, etc.). Group B Strep is NOT a contraindication for  waterbirth.  If your labor has to be induced and induction method requires continuous  monitoring of the baby's heart rate  Other risks/issues identified by your obstetrical provider  Please remember that birth is unpredictable. Under certain unforeseeable circumstances your provider may advise against giving birth in the tub. These decisions will be made on a case-by-case basis and with the safety of you and your baby as our highest priority.   Safe Medications in Pregnancy   Acne:  Benzoyl Peroxide  Salicylic Acid   Backache/Headache:  Tylenol: 2 regular strength every 4 hours OR        2 Extra strength every 6 hours   Colds/Coughs/Allergies:  Benadryl (alcohol free) 25 mg every 6 hours as needed  Breath right strips  Claritin  Cepacol throat lozenges  Chloraseptic throat spray  Cold-Eeze- up to three times per day  Cough drops, alcohol free  Flonase (by prescription only)  Guaifenesin  Mucinex  Robitussin DM (plain only, alcohol free)  Saline nasal spray/drops  Sudafed (pseudoephedrine) & Actifed * use only after [redacted] weeks gestation and if you do not have high blood pressure  Tylenol  Vicks Vaporub  Zinc lozenges  Zyrtec   Constipation:  Colace  Ducolax suppositories  Fleet enema  Glycerin suppositories  Metamucil  Milk of magnesia  Miralax  Senokot  Smooth move tea   Diarrhea:  Kaopectate  Imodium A-D   *NO pepto Bismol   Hemorrhoids:  Anusol  Anusol HC  Preparation H  Tucks   Indigestion:  Tums  Maalox  Mylanta  Zantac  Pepcid   Insomnia:  Benadryl (alcohol free) 25mg every 6 hours as needed  Tylenol PM  Unisom, no Gelcaps   Leg Cramps:  Tums  MagGel   Nausea/Vomiting:  Bonine  Dramamine  Emetrol  Ginger extract  Sea bands  Meclizine  Nausea medication to take during pregnancy:  Unisom (doxylamine succinate 25 mg tablets) Take one  tablet daily at bedtime. If symptoms are not adequately controlled, the dose can be increased to a maximum recommended dose of two tablets daily (1/2 tablet in the morning, 1/2 tablet mid-afternoon and one at bedtime).  Vitamin B6 100mg tablets. Take one tablet twice a day (up to 200 mg per day).   Skin Rashes:  Aveeno products  Benadryl cream or 25mg every 6 hours as needed  Calamine Lotion  1% cortisone cream   Yeast infection:  Gyne-lotrimin 7  Monistat 7    **If taking multiple medications, please check labels to avoid duplicating the same active ingredients  **take medication as directed on the label  ** Do not exceed 4000 mg of tylenol in 24 hours  **Do not take medications that contain aspirin or ibuprofen            

## 2017-07-18 ENCOUNTER — Telehealth: Payer: Self-pay

## 2017-07-18 MED ORDER — OSELTAMIVIR PHOSPHATE 75 MG PO CAPS: 75.0000 mg | ORAL_CAPSULE | Freq: Every day | ORAL | 0 refills | Status: DC

## 2017-07-18 NOTE — Telephone Encounter (Signed)
Patient called stating that her mother in law and her son have been diagnosed with the flu. She is 32.[redacted] weeks pregnant and wondering if she could have tamiflu.   Discussed with Dr. Adrian BlackwaterStinson who said approved  75mg  once a day for seven days.   Called and verified pharmacy with the patient. Sent RX per Dr. Adrian BlackwaterStinson. Armandina StammerJennifer Eldean Klatt RNBSN

## 2017-08-08 ENCOUNTER — Telehealth: Payer: Self-pay

## 2017-08-08 MED ORDER — ONDANSETRON HCL 4 MG PO TABS: 4.0000 mg | ORAL_TABLET | Freq: Three times a day (TID) | ORAL | 0 refills | Status: DC | PRN

## 2017-08-08 NOTE — Telephone Encounter (Signed)
Patient requesting something for nausea and vomiting. Patient has had vomiting x1 day. Patient states that someone in the house was sick with a virus. Patient thinks she got it. Patient instructed to hydrate with water and/or sports drink. Patient instructed if she is unable to keep anything down [facility]  Patient is  35.5 weeks and states baby is moving well. Julia StammerJennifer Howard RNBSN

## 2017-08-13 ENCOUNTER — Ambulatory Visit (INDEPENDENT_AMBULATORY_CARE_PROVIDER_SITE_OTHER): Payer: BLUE CROSS/BLUE SHIELD | Admitting: Advanced Practice Midwife

## 2017-08-13 ENCOUNTER — Other Ambulatory Visit (HOSPITAL_COMMUNITY)
Admission: RE | Admit: 2017-08-13 | Discharge: 2017-08-13 | Disposition: A | Discharge status: Home / Self Care | Payer: BLUE CROSS/BLUE SHIELD | Source: Ambulatory Visit | Attending: Advanced Practice Midwife | Admitting: Advanced Practice Midwife

## 2017-08-13 VITALS — BP 114/83 | HR 90 | Wt 242.0 lb

## 2017-08-13 DIAGNOSIS — B372 Candidiasis of skin and nail: Secondary | ICD-10-CM

## 2017-08-13 DIAGNOSIS — Z3483 Encounter for supervision of other normal pregnancy, third trimester: Secondary | ICD-10-CM | POA: Insufficient documentation

## 2017-08-13 MED ORDER — NYSTATIN 100000 UNIT/GM EX CREA: 1.0000 "application " | TOPICAL_CREAM | Freq: Two times a day (BID) | CUTANEOUS | 1 refills | Status: DC

## 2017-08-13 NOTE — Patient Instructions (Signed)
Skin Yeast Infection Skin yeast infection is a condition in which there is an overgrowth of yeast (candida) that normally lives on the skin. This condition usually occurs in areas of the skin that are constantly warm and moist, such as the armpits or the groin. What are the causes? This condition is caused by a change in the normal balance of the yeast and bacteria that live on the skin. What increases the risk? This condition is more likely to develop in:  People who are obese.  Pregnant women.  Women who take birth control pills.  People who have diabetes.  People who take antibiotic medicines.  People who take steroid medicines.  People who are malnourished.  People who have a weak defense (immune) system.  People who are 65 years of age or older.  What are the signs or symptoms? Symptoms of this condition include:  A red, swollen area of the skin.  Bumps on the skin.  Itchiness.  How is this diagnosed? This condition is diagnosed with a medical history and physical exam. Your health care provider may check for yeast by taking light scrapings of the skin to be viewed under a microscope. How is this treated? This condition is treated with medicine. Medicines may be prescribed or be available over-the-counter. The medicines may be:  Taken by mouth (orally).  Applied as a cream.  Follow these instructions at home:  Take or apply over-the-counter and prescription medicines only as told by your health care provider.  Eat more yogurt. This may help to keep your yeast infection from returning.  Maintain a healthy weight. If you need help losing weight, talk with your health care provider.  Keep your skin clean and dry.  If you have diabetes, keep your blood sugar under control. Contact a health care provider if:  Your symptoms go away and then return.  Your symptoms do not get better with treatment.  Your symptoms get worse.  Your rash spreads.  You have a  fever or chills.  You have new symptoms.  You have new warmth or redness of your skin. This information is not intended to replace advice given to you by your health care provider. Make sure you discuss any questions you have with your health care provider. Document Released: 01/16/2011 Document Revised: 12/25/2015 Document Reviewed: 11/01/2014 Elsevier Interactive Patient Education  2018 Elsevier Inc.  

## 2017-08-13 NOTE — Progress Notes (Signed)
   PRENATAL VISIT NOTE  Subjective:  Julia Acosta is a 32 y.o. G2P1001 at 6741w3d being seen today for ongoing prenatal care.  She is currently monitored for the following issues for this low-risk pregnancy and has Supervision of other normal pregnancy, antepartum; GBS bacteriuria; and Obesity affecting pregnancy, antepartum on their problem list.  Patient reports rash on groin/inner thigh area x 2 days with itching and irritation.  Contractions: Irritability. Vag. Bleeding: None.  Movement: Present. Denies leaking of fluid.   The following portions of the patient's history were reviewed and updated as appropriate: allergies, current medications, past family history, past medical history, past social history, past surgical history and problem list. Problem list updated.  Objective:   Vitals:   08/13/17 0826  BP: 114/83  Pulse: 90  Weight: 242 lb (109.8 kg)    Fetal Status: Fetal Heart Rate (bpm): 148   Movement: Present     General:  Alert, oriented and cooperative. Patient is in no acute distress.  Skin: Skin is warm and dry. No rash noted.   Cardiovascular: Normal heart rate noted  Respiratory: Normal respiratory effort, no problems with respiration noted  Abdomen: Soft, gravid, appropriate for gestational age.  Pain/Pressure: Present     Pelvic: Cervical exam deferred        Extremities: Normal range of motion.  Edema: None  Mental Status: Normal mood and affect. Normal behavior. Normal judgment and thought content.  Pelvic exam: visual inspection reveals erythema with flaky patches of skin in folds of groin  Assessment and Plan:  Pregnancy: G2P1001 at 4041w3d  1. Encounter for supervision of other normal pregnancy in third trimester --Cervical exam deferred. Fetal position vertex by [place] today.  - GC[facility]Chlamydia [facility] amp [facility]  2. Candidiasis of skin  - nystatin cream (MYCOSTATIN); Apply 1 application topically 2 (two) times daily.  Dispense: 30 g;  Refill: 1  Preterm labor symptoms and general obstetric precautions including but not limited to vaginal bleeding, contractions, leaking of fluid and fetal movement were reviewed in detail with the patient. Please refer to After Visit Summary for other counseling recommendations.  No follow-ups on file.  No future appointments.  Sharen CounterLisa Leftwich-Kirby, CNM

## 2017-08-14 LAB — GC/CHLAMYDIA PROBE AMP (~~LOC~~) NOT AT ARMC
CHLAMYDIA, DNA PROBE: NEGATIVE
Neisseria Gonorrhea: NEGATIVE

## 2017-08-27 ENCOUNTER — Ambulatory Visit (INDEPENDENT_AMBULATORY_CARE_PROVIDER_SITE_OTHER): Payer: BLUE CROSS/BLUE SHIELD | Admitting: Nurse Practitioner

## 2017-08-27 VITALS — BP 124/83 | HR 87 | Wt 243.0 lb

## 2017-08-27 DIAGNOSIS — R8271 Bacteriuria: Secondary | ICD-10-CM

## 2017-08-27 DIAGNOSIS — Z3480 Encounter for supervision of other normal pregnancy, unspecified trimester: Secondary | ICD-10-CM

## 2017-08-27 DIAGNOSIS — Z3483 Encounter for supervision of other normal pregnancy, third trimester: Secondary | ICD-10-CM

## 2017-08-27 NOTE — Progress Notes (Signed)
    Subjective:  Julia Acosta is a 32 y.o. G2P1001 at 3736w3d being seen today for ongoing prenatal care.  She is currently monitored for the following issues for this low-risk pregnancy and has Supervision of other normal pregnancy, antepartum; GBS bacteriuria; and Obesity affecting pregnancy, antepartum on their problem list.  Patient reports no complaints.  Contractions: Irritability. Vag. Bleeding: None.  Movement: Present. Denies leaking of fluid.   The following portions of the patient's history were reviewed and updated as appropriate: allergies, current medications, past family history, past medical history, past social history, past surgical history and problem list. Problem list updated.  Objective:   Vitals:   08/27/17 0844  BP: 124/83  Pulse: 87  Weight: 243 lb (110.2 kg)    Fetal Status: Fetal Heart Rate (bpm): 138 Fundal Height: 40 cm Movement: Present     General:  Alert, oriented and cooperative. Patient is in no acute distress.  Skin: Skin is warm and dry. No rash noted.   Cardiovascular: Normal heart rate noted  Respiratory: Normal respiratory effort, no problems with respiration noted  Abdomen: Soft, gravid, appropriate for gestational age. Pain/Pressure: Present     Pelvic:  Cervical exam deferred        Extremities: Normal range of motion.  Edema: None  Mental Status: Normal mood and affect. Normal behavior. Normal judgment and thought content.   Urinalysis:    NA  Assessment and Plan:  Pregnancy: G2P1001 at 10536w3d  1. Supervision of other normal pregnancy, antepartum Doing well.  Baby moving.  Ready for labor to begin.  Has her bag packed.  2. GBS bacteriuria Client aware to go to the hospital if her water breaks.  Term labor symptoms and general obstetric precautions including but not limited to vaginal bleeding, contractions, leaking of fluid and fetal movement were reviewed in detail with the patient. Please refer to After Visit Summary for other  counseling recommendations.  Return in about 1 week (around 09/03/2017).  Nolene BernheimERRI BURLESON, RN, MSN, NP-BC Nurse Practitioner, [facility], [facility] 08/27/2017 9:22 AM

## 2017-08-31 ENCOUNTER — Inpatient Hospital Stay (EMERGENCY_DEPARTMENT_HOSPITAL)
Admission: AD | Admit: 2017-08-31 | Discharge: 2017-08-31 | Disposition: A | Discharge status: Home / Self Care | Payer: BLUE CROSS/BLUE SHIELD | Source: Ambulatory Visit | Attending: Family Medicine | Admitting: Family Medicine

## 2017-08-31 ENCOUNTER — Encounter (HOSPITAL_COMMUNITY): Payer: Self-pay

## 2017-08-31 DIAGNOSIS — O99210 Obesity complicating pregnancy, unspecified trimester: Secondary | ICD-10-CM

## 2017-08-31 DIAGNOSIS — O471 False labor at or after 37 completed weeks of gestation: Secondary | ICD-10-CM | POA: Diagnosis not present

## 2017-08-31 DIAGNOSIS — Z3480 Encounter for supervision of other normal pregnancy, unspecified trimester: Secondary | ICD-10-CM

## 2017-08-31 DIAGNOSIS — R8271 Bacteriuria: Secondary | ICD-10-CM

## 2017-08-31 NOTE — Discharge Instructions (Signed)
Braxton Hicks Contractions °Contractions of the uterus can occur throughout pregnancy, but they are not always a sign that you are in labor. You may have practice contractions called Braxton Hicks contractions. These false labor contractions are sometimes confused with true labor. °What are Braxton Hicks contractions? °Braxton Hicks contractions are tightening movements that occur in the muscles of the uterus before labor. Unlike true labor contractions, these contractions do not result in opening (dilation) and thinning of the cervix. Toward the end of pregnancy (32-34 weeks), Braxton Hicks contractions can happen more often and may become stronger. These contractions are sometimes difficult to tell apart from true labor because they can be very uncomfortable. You should not feel embarrassed if you go to the hospital with false labor. °Sometimes, the only way to tell if you are in true labor is for your health care provider to look for changes in the cervix. The health care provider will do a physical exam and may monitor your contractions. If you are not in true labor, the exam should show that your cervix is not dilating and your water has not broken. °If there are other health problems associated with your pregnancy, it is completely safe for you to be sent home with false labor. You may continue to have Braxton Hicks contractions until you go into true labor. °How to tell the difference between true labor and false labor °True labor °· Contractions last 30-70 seconds. °· Contractions become very regular. °· Discomfort is usually felt in the top of the uterus, and it spreads to the lower abdomen and low back. °· Contractions do not go away with walking. °· Contractions usually become more intense and increase in frequency. °· The cervix dilates and gets thinner. °False labor °· Contractions are usually shorter and not as strong as true labor contractions. °· Contractions are usually irregular. °· Contractions  are often felt in the front of the lower abdomen and in the groin. °· Contractions may go away when you walk around or change positions while lying down. °· Contractions get weaker and are shorter-lasting as time goes on. °· The cervix usually does not dilate or become thin. °Follow these instructions at home: °· Take over-the-counter and prescription medicines only as told by your health care provider. °· Keep up with your usual exercises and follow other instructions from your health care provider. °· Eat and drink lightly if you think you are going into labor. °· If Braxton Hicks contractions are making you uncomfortable: °? Change your position from lying down or resting to walking, or change from walking to resting. °? Sit and rest in a tub of warm water. °? Drink enough fluid to keep your urine pale yellow. Dehydration may cause these contractions. °? Do slow and deep breathing several times an hour. °· Keep all follow-up prenatal visits as told by your health care provider. This is important. °Contact a health care provider if: °· You have a fever. °· You have continuous pain in your abdomen. °Get help right away if: °· Your contractions become stronger, more regular, and closer together. °· You have fluid leaking or gushing from your vagina. °· You pass blood-tinged mucus (bloody show). °· You have bleeding from your vagina. °· You have low back pain that you never had before. °· You feel your baby’s head pushing down and causing pelvic pressure. °· Your baby is not moving inside you as much as it used to. °Summary °· Contractions that occur before labor are called Braxton   Hicks contractions, false labor, or practice contractions. °· Braxton Hicks contractions are usually shorter, weaker, farther apart, and less regular than true labor contractions. True labor contractions usually become progressively stronger and regular and they become more frequent. °· Manage discomfort from Braxton Hicks contractions by  changing position, resting in a warm bath, drinking plenty of water, or practicing deep breathing. °This information is not intended to replace advice given to you by your health care provider. Make sure you discuss any questions you have with your health care provider. °Document Released: 09/13/2016 Document Revised: 09/13/2016 Document Reviewed: 09/13/2016 °Elsevier Interactive Patient Education © 2018 [employer] Inc[employer] ° °

## 2017-08-31 NOTE — MAU Note (Signed)
Pt states that cntrx started at 1800, now 5 mins apart.  May have bloody show . Denies LOF. +FM.

## 2017-08-31 NOTE — MAU Note (Signed)
I have communicated with Steward DroneVeronica Rogers, CNM and reviewed vital signs:  Vitals:   08/31/17 2139 08/31/17 2244  BP: 121/85 120/88  Pulse: 95   Resp: 18   Temp: 98 F (36.7 C)   SpO2: 99%     Vaginal exam:  Dilation: Fingertip Cervical Position: Posterior Presentation: Vertex Exam by:: Roxan Hockeyraci Kacen Mellinger  RN ,   Also reviewed contraction pattern and that non-stress test is reactive.  It has been documented that patient is contracting irregularly. Her cervix was  0.5 cm  not indicating active labor.  Patient denies any other complaints.  Based on this report provider has given order for discharge.  A discharge order and diagnosis entered by a provider.   Labor discharge instructions reviewed with patient.

## 2017-09-01 ENCOUNTER — Inpatient Hospital Stay (HOSPITAL_COMMUNITY)
Admission: AD | Admit: 2017-09-01 | Discharge: 2017-09-04 | DRG: 807 | Disposition: A | Discharge status: Home / Self Care | Payer: BLUE CROSS/BLUE SHIELD | Source: Ambulatory Visit | Attending: Family Medicine | Admitting: Family Medicine

## 2017-09-01 ENCOUNTER — Encounter (HOSPITAL_COMMUNITY): Payer: Self-pay

## 2017-09-01 DIAGNOSIS — Z3A39 39 weeks gestation of pregnancy: Secondary | ICD-10-CM

## 2017-09-01 DIAGNOSIS — Z3480 Encounter for supervision of other normal pregnancy, unspecified trimester: Secondary | ICD-10-CM

## 2017-09-01 DIAGNOSIS — Z3483 Encounter for supervision of other normal pregnancy, third trimester: Secondary | ICD-10-CM | POA: Diagnosis present

## 2017-09-01 DIAGNOSIS — E669 Obesity, unspecified: Secondary | ICD-10-CM | POA: Diagnosis present

## 2017-09-01 DIAGNOSIS — O99824 Streptococcus B carrier state complicating childbirth: Principal | ICD-10-CM | POA: Diagnosis present

## 2017-09-01 DIAGNOSIS — R8271 Bacteriuria: Secondary | ICD-10-CM

## 2017-09-01 DIAGNOSIS — O99214 Obesity complicating childbirth: Secondary | ICD-10-CM | POA: Diagnosis present

## 2017-09-01 DIAGNOSIS — O99210 Obesity complicating pregnancy, unspecified trimester: Secondary | ICD-10-CM

## 2017-09-01 LAB — CBC
HCT: 33.1 % — ABNORMAL LOW (ref 36.0–46.0)
Hemoglobin: 10.9 g/dL — ABNORMAL LOW (ref 12.0–15.0)
MCH: 27.1 pg (ref 26.0–34.0)
MCHC: 32.9 g/dL (ref 30.0–36.0)
MCV: 82.3 fL (ref 78.0–100.0)
PLATELETS: 273 10*3/uL (ref 150–400)
RBC: 4.02 MIL/uL (ref 3.87–5.11)
RDW: 16.7 % — AB (ref 11.5–15.5)
WBC: 6.8 10*3/uL (ref 4.0–10.5)

## 2017-09-01 MED ORDER — OXYTOCIN BOLUS FROM INFUSION
500.0000 mL | Freq: Once | INTRAVENOUS | Status: AC
  Administered 2017-09-02: 500 mL via INTRAVENOUS

## 2017-09-01 MED ORDER — LACTATED RINGERS IV SOLN: INTRAVENOUS | Status: DC

## 2017-09-01 MED ORDER — OXYCODONE-ACETAMINOPHEN 5-325 MG PO TABS: 1.0000 | ORAL_TABLET | ORAL | Status: DC | PRN

## 2017-09-01 MED ORDER — OXYTOCIN 40 UNITS IN LACTATED RINGERS INFUSION - SIMPLE MED
2.5000 [IU]/h | INTRAVENOUS | Status: DC
  Administered 2017-09-02: 2.5 [IU]/h via INTRAVENOUS
  Filled 2017-09-01: qty 1000

## 2017-09-01 MED ORDER — SODIUM CHLORIDE 0.9 % IV SOLN
5.0000 10*6.[IU] | Freq: Once | INTRAVENOUS | Status: AC
  Administered 2017-09-02: 5 10*6.[IU] via INTRAVENOUS
  Filled 2017-09-01: qty 5

## 2017-09-01 MED ORDER — ONDANSETRON HCL 4 MG/2ML IJ SOLN: 4.0000 mg | Freq: Four times a day (QID) | INTRAMUSCULAR | Status: DC | PRN

## 2017-09-01 MED ORDER — ACETAMINOPHEN 325 MG PO TABS
650.0000 mg | ORAL_TABLET | ORAL | Status: DC | PRN
  Administered 2017-09-02: 650 mg via ORAL
  Filled 2017-09-01: qty 2

## 2017-09-01 MED ORDER — LACTATED RINGERS IV SOLN
500.0000 mL | INTRAVENOUS | Status: DC | PRN
  Administered 2017-09-02: 500 mL via INTRAVENOUS

## 2017-09-01 MED ORDER — FENTANYL CITRATE (PF) 100 MCG/2ML IJ SOLN
100.0000 ug | INTRAMUSCULAR | Status: DC | PRN
  Administered 2017-09-02: 100 ug via INTRAVENOUS
  Filled 2017-09-01 (×2): qty 2

## 2017-09-01 MED ORDER — PENICILLIN G POT IN DEXTROSE 60000 UNIT/ML IV SOLN
3.0000 10*6.[IU] | INTRAVENOUS | Status: DC
  Filled 2017-09-01 (×3): qty 50

## 2017-09-01 MED ORDER — SOD CITRATE-CITRIC ACID 500-334 MG/5ML PO SOLN: 30.0000 mL | ORAL | Status: DC | PRN

## 2017-09-01 MED ORDER — OXYCODONE-ACETAMINOPHEN 5-325 MG PO TABS: 2.0000 | ORAL_TABLET | ORAL | Status: DC | PRN

## 2017-09-01 MED ORDER — LIDOCAINE HCL (PF) 1 % IJ SOLN
30.0000 mL | INTRAMUSCULAR | Status: AC | PRN
  Administered 2017-09-02: 30 mL via SUBCUTANEOUS
  Filled 2017-09-01: qty 30

## 2017-09-01 NOTE — H&P (Signed)
Julia Acosta is a 32 y.o. female G2P1001 @ 39.1wks by 9wk U/S presenting for reg ctx over the weekend. Denies leaking or bldg; no N/V, H/A. Her preg has been followed by the [facility] office and has been remarkable for 1) obesity 2) GBS bacteriuria  OB History    Gravida  2   Para  1   Term  1   Preterm      AB      Living  1     SAB      TAB      Ectopic      Multiple  0   Live Births  1          Past Medical History:  Diagnosis Date  . Bartholin cyst   . UTI (urinary tract infection)    Past Surgical History:  Procedure Laterality Date  . ABCESS DRAINAGE     Bartholin cyst   Family History: family history includes Diabetes in her father and mother; Hyperlipidemia in her father; Hypertension in her father and mother. Social History:  reports that she has never smoked. She has never used smokeless tobacco. She reports that she does not drink alcohol or use drugs.     Maternal Diabetes: No Genetic Screening: Normal Maternal Ultrasounds/Referrals: Normal Fetal Ultrasounds or other Referrals:  None Maternal Substance Abuse:  No Significant Maternal Medications:  None Significant Maternal Lab Results:  Lab values include: Group B Strep positive Other Comments:  None  ROS History Dilation: 4.5 Effacement (%): 70 Station: -2 Exam by:: Roxan Hockeyraci Lytle RNC Blood pressure 130/84, pulse (!) 117, temperature 97.7 F (36.5 C), temperature source Oral, resp. rate 18, height 5\' 3"  (1.6 m), weight 111.1 kg (245 lb 0.6 oz), last menstrual period 11/24/2016, SpO2 100 %, currently breastfeeding. Exam Physical Exam  Constitutional: She is oriented to person, place, and time. She appears well-developed.  HENT:  Head: Normocephalic.  Neck: Normal range of motion.  Cardiovascular:  Tachy  Respiratory: Effort normal.  GI:  EFM 150s, +accels, no decels, +LTV, Cat 1 Ctx irreg, difficult to trace  Musculoskeletal: Normal range of motion.  Neurological: She is alert and  oriented to person, place, and time.  Skin: Skin is warm and dry.  Psychiatric: She has a normal mood and affect. Her behavior is normal. Thought content normal.    Prenatal labs: ABO, Rh: O/Positive/-- (09/25 1149) Antibody: Negative (09/25 1149) Rubella: 1.57 (09/25 1149) RPR: Non Reactive (02/05 0918)  HBsAg: Negative (09/25 1149)  HIV: Non Reactive (02/05 0918)  GBS:   pos via urine culture  Assessment/Plan: IUP@term  Latent labor GBS pos  [facility] GBS ppx Anticipate SVD   Cam HaiSHAW, KIMBERLY CNM 09/01/2017, 10:51 PM

## 2017-09-01 NOTE — MAU Note (Signed)
Contractions 5 mins apart since around 1900. Denies LOF or bleeding. +FM. Was here yesterday for labor check.

## 2017-09-02 ENCOUNTER — Encounter (HOSPITAL_COMMUNITY): Payer: Self-pay

## 2017-09-02 DIAGNOSIS — Z3A39 39 weeks gestation of pregnancy: Secondary | ICD-10-CM

## 2017-09-02 DIAGNOSIS — O99824 Streptococcus B carrier state complicating childbirth: Secondary | ICD-10-CM

## 2017-09-02 LAB — RPR: RPR: NONREACTIVE

## 2017-09-02 LAB — TYPE AND SCREEN
ABO/RH(D): O POS
ANTIBODY SCREEN: NEGATIVE

## 2017-09-02 MED ORDER — OXYCODONE HCL 5 MG PO TABS: 5.0000 mg | ORAL_TABLET | ORAL | Status: DC | PRN

## 2017-09-02 MED ORDER — DIBUCAINE 1 % RE OINT: 1.0000 "application " | TOPICAL_OINTMENT | RECTAL | Status: DC | PRN

## 2017-09-02 MED ORDER — FENTANYL 2.5 MCG/ML BUPIVACAINE 1/10 % EPIDURAL INFUSION (WH - ANES): 14.0000 mL/h | INTRAMUSCULAR | Status: DC | PRN

## 2017-09-02 MED ORDER — ACETAMINOPHEN 325 MG PO TABS: 650.0000 mg | ORAL_TABLET | ORAL | Status: DC | PRN

## 2017-09-02 MED ORDER — FENTANYL 2.5 MCG/ML BUPIVACAINE 1/10 % EPIDURAL INFUSION (WH - ANES)
INTRAMUSCULAR | Status: AC
  Filled 2017-09-02: qty 100

## 2017-09-02 MED ORDER — EPHEDRINE 5 MG/ML INJ
10.0000 mg | INTRAVENOUS | Status: DC | PRN
  Filled 2017-09-02: qty 2

## 2017-09-02 MED ORDER — SIMETHICONE 80 MG PO CHEW: 80.0000 mg | CHEWABLE_TABLET | ORAL | Status: DC | PRN

## 2017-09-02 MED ORDER — DIPHENHYDRAMINE HCL 25 MG PO CAPS: 25.0000 mg | ORAL_CAPSULE | Freq: Four times a day (QID) | ORAL | Status: DC | PRN

## 2017-09-02 MED ORDER — TETANUS-DIPHTH-ACELL PERTUSSIS 5-2.5-18.5 LF-MCG/0.5 IM SUSP: 0.5000 mL | Freq: Once | INTRAMUSCULAR | Status: DC

## 2017-09-02 MED ORDER — PRENATAL MULTIVITAMIN CH
1.0000 | ORAL_TABLET | Freq: Every day | ORAL | Status: DC
  Administered 2017-09-02 – 2017-09-04 (×3): 1 via ORAL
  Filled 2017-09-02 (×3): qty 1

## 2017-09-02 MED ORDER — LACTATED RINGERS IV SOLN: 500.0000 mL | Freq: Once | INTRAVENOUS | Status: DC

## 2017-09-02 MED ORDER — PHENYLEPHRINE 40 MCG/ML (10ML) SYRINGE FOR IV PUSH (FOR BLOOD PRESSURE SUPPORT)
80.0000 ug | PREFILLED_SYRINGE | INTRAVENOUS | Status: DC | PRN
  Filled 2017-09-02: qty 5

## 2017-09-02 MED ORDER — WITCH HAZEL-GLYCERIN EX PADS: 1.0000 "application " | MEDICATED_PAD | CUTANEOUS | Status: DC | PRN

## 2017-09-02 MED ORDER — ONDANSETRON HCL 4 MG/2ML IJ SOLN: 4.0000 mg | INTRAMUSCULAR | Status: DC | PRN

## 2017-09-02 MED ORDER — IBUPROFEN 600 MG PO TABS
600.0000 mg | ORAL_TABLET | Freq: Four times a day (QID) | ORAL | Status: DC
  Administered 2017-09-02 – 2017-09-04 (×10): 600 mg via ORAL
  Filled 2017-09-02 (×11): qty 1

## 2017-09-02 MED ORDER — COCONUT OIL OIL: 1.0000 "application " | TOPICAL_OIL | Status: DC | PRN

## 2017-09-02 MED ORDER — PHENYLEPHRINE 40 MCG/ML (10ML) SYRINGE FOR IV PUSH (FOR BLOOD PRESSURE SUPPORT)
PREFILLED_SYRINGE | INTRAVENOUS | Status: AC
  Filled 2017-09-02: qty 10

## 2017-09-02 MED ORDER — ZOLPIDEM TARTRATE 5 MG PO TABS: 5.0000 mg | ORAL_TABLET | Freq: Every evening | ORAL | Status: DC | PRN

## 2017-09-02 MED ORDER — DIPHENHYDRAMINE HCL 50 MG/ML IJ SOLN: 12.5000 mg | INTRAMUSCULAR | Status: DC | PRN

## 2017-09-02 MED ORDER — BENZOCAINE-MENTHOL 20-0.5 % EX AERO
1.0000 "application " | INHALATION_SPRAY | CUTANEOUS | Status: DC | PRN
  Administered 2017-09-02: 1 via TOPICAL
  Filled 2017-09-02: qty 56

## 2017-09-02 MED ORDER — ONDANSETRON HCL 4 MG PO TABS: 4.0000 mg | ORAL_TABLET | ORAL | Status: DC | PRN

## 2017-09-02 MED ORDER — SENNOSIDES-DOCUSATE SODIUM 8.6-50 MG PO TABS
2.0000 | ORAL_TABLET | ORAL | Status: DC
  Administered 2017-09-02 – 2017-09-03 (×2): 2 via ORAL
  Filled 2017-09-02 (×2): qty 2

## 2017-09-02 NOTE — Lactation Note (Addendum)
This note was copied from a baby's chart. Lactation Consultation Note  Patient Name: Julia Acosta Today's Date: 09/02/2017 Reason for consult: Initial assessment  Mom is a P2 who nursed her 1st child for 17 months. Per Mom, it took about 1 week for her milk to come to volume. Mom's 1st child was in the NICU.   Mom knows how to use the hand pump included in the pump kit. She does have [place].   Formula switched from Dory Horn to a halal formula (Similac).   This infant is DAT+.  Matthias Hughs [facility] 09/02/2017, 8:45 PM

## 2017-09-03 ENCOUNTER — Other Ambulatory Visit: Payer: Self-pay

## 2017-09-03 ENCOUNTER — Encounter (HOSPITAL_COMMUNITY): Payer: Self-pay | Admitting: *Deleted

## 2017-09-03 ENCOUNTER — Encounter: Payer: BLUE CROSS/BLUE SHIELD | Admitting: Advanced Practice Midwife

## 2017-09-03 LAB — BIRTH TISSUE RECOVERY COLLECTION (PLACENTA DONATION)

## 2017-09-03 NOTE — Progress Notes (Signed)
Post Partum Day 1 Subjective: no complaints, up ad lib, voiding and tolerating PO  Objective: Blood pressure 135/80, pulse 73, temperature 98 F (36.7 C), resp. rate 17, height 5\' 3"  (1.6 m), weight 111.1 kg (245 lb 0.6 oz), last menstrual period 11/24/2016, SpO2 100 %, unknown if currently breastfeeding.  Physical Exam:  General: alert, cooperative and no distress Lochia: appropriate Uterine Fundus: firm Incision: n/a DVT Evaluation: No evidence of DVT seen on physical exam.  Recent Labs    09/01/17 2300  HGB 10.9*  HCT 33.1*    Assessment/Plan: Plan for discharge tomorrow, Breastfeeding, Lactation consult and Contraception condoms   LOS: 2 days   Myrene BuddyJacob Fletcher 09/03/2017, 5:28 AM   I confirm that I have verified the information documented in the resident's note and that I have also personally reperformed the physical exam and all medical decision making activities. Patient was seen and examined by me also Agree with note Vitals stable Labs stable Fundus firm, lochia within normal limits Perineum healing Ext WNL Continue care Anticipate discharge tomorrow, since baby needs to stay for observation (under treated for GBS)  Aviva SignsWilliams, [place] L, CNM

## 2017-09-03 NOTE — Lactation Note (Signed)
This note was copied from a baby's chart. Lactation Consultation Note  Patient Name: Julia Acosta QMVHQ'IToday's Date: 09/03/2017 Reason for consult: Follow-up assessment   Follow up with mom of 38 hour old infant. Infant with 2 BF attempts, EBM x 2 of 10-15 cc, formula x 12 of 6-50 cc, 4 voids and 4 stools in the last 24 hours. Infant weight 6 pounds 14.2 ounces with 2% weight loss since birth. LATCH scores 8.   Mom reports infant is sleepy at the breast. Enc mom to call out for assistance as needed. Mom denies pain with feeding.   Mom is pumping every 3 hours and is getting colostrum for infant. She reports her breasts are feeling fuller today and she is getting colostrum from both breasts.   Mom reports she has a manual pump for home use. She reports WIC provided a pump for her for her last infant who was in the NICU. [facility] but it does depend on what package mom chose and whether infant is in the NICU at time of d/c. Mom voiced understanding.   Mom reports she has no questions/concens. She reports she does not need assistance at this time. Enc mom to call for assistance as needed.    Maternal Data Formula Feeding for Exclusion: Yes Reason for exclusion: Mother's choice to formula and breast feed on admission Does the patient have breastfeeding experience prior to this delivery?: Yes  Feeding Feeding Type: Bottle Fed - Breast Milk Nipple Type: Slow - flow  LATCH Score                   Interventions    Lactation Tools Discussed/Used WIC Program: Yes Pump Review: Setup, frequency, and cleaning Initiated by:: Reviewed and encouraged 8-12 x a day post BF   Consult Status Consult Status: Follow-up Date: 09/04/17 Follow-up type: In-patient    Silas FloodSharon S Annet Manukyan 09/03/2017, 4:33 PM

## 2017-09-04 MED ORDER — IBUPROFEN 600 MG PO TABS: 600.0000 mg | ORAL_TABLET | Freq: Four times a day (QID) | ORAL | 0 refills | Status: DC

## 2017-09-04 NOTE — Lactation Note (Signed)
This note was copied from a baby's chart. Lactation Consultation Note  Patient Name: Girl Azaliyah Kennard [id number] Date: 09/04/2017 Reason for consult: Follow-up assessment;Infant weight loss;Term(1% weight loss/ breast / formula/ mostly bottles / per mom plans to breast  feed at home ) Randel Books is 66 hours old  LC reviewed and updated the doc flow sheets / per mom  LC reviewed sore nipple and engorgement prevention and tx.  Per mom has a hand pump for D/C and a DEBP kit that she has been using in the hospital. Milk is in and she just pumped off 40 ml./ grandmother had just fed baby formula.  Per mom active with WIC / [place]. LC recommended to mom to call [place] and [facility] faxed a DEBP request for her.  Also LC showed mom how to use the DEBP kit manually.  Mother informed of post-discharge support and given phone number to the lactation department, including services for phone call assistance; out-patient appointments; and breastfeeding support group. List of other breastfeeding resources in the community given in the handout. Encouraged mother to call for problems or concerns related to breastfeeding.  Maternal Data    Feeding Feeding Type: Formula Nipple Type: Slow - flow  LATCH Score                   Interventions Interventions: Breast feeding basics reviewed  Lactation Tools Discussed/Used WIC Program: Yes(LC faxed a DEBP request to [place] this am ) Pump Review: Milk Storage   Consult Status Consult Status: Complete Date: 09/04/17    Jerlyn Ly Lemoine Goyne 09/04/2017, 11:03 AM

## 2017-09-04 NOTE — Discharge Summary (Addendum)
OB Discharge Summary     Patient Name: Julia Acosta DOB: 1986-03-06 MRN: [id number]  Date of admission: 09/01/2017 Delivering MD: Cam HaiSHAW, KIMBERLY D   Date of discharge: 09/04/2017  Admitting diagnosis: 39 WKS, CTXS Intrauterine pregnancy: 1559w2d     Secondary diagnosis:  Active Problems:   NSVD (normal spontaneous vaginal delivery)  Additional problems:      Discharge diagnosis: Term Pregnancy Delivered                                                                                                Post partum procedures:none  Augmentation: none  Complications: None  Hospital course:  Onset of Labor With Vaginal Delivery     32 y.o. yo G2P2002 at 7159w2d was admitted in Active Labor on 09/01/2017. Patient had an uncomplicated labor course as follows:  Membrane Rupture Time/Date: 2:21 AM ,09/02/2017   Intrapartum Procedures: Episiotomy: None [1]                                         Lacerations:  2nd degree [3];Perineal [11]  Patient had a delivery of a Viable infant. 09/02/2017  Information for the patient's newborn:  Julia Acosta, Julia Acosta [[id number]]  Delivery Method: Vaginal, Spontaneous(Filed from Delivery Summary)    Pateint had an uncomplicated postpartum course.  She is ambulating, tolerating a regular diet, passing flatus, and urinating well. Patient is discharged home in stable condition on 09/04/17.   Physical exam  Vitals:   09/02/17 1837 09/03/17 0557 09/03/17 1905 09/04/17 0531  BP: 135/80 117/86 138/80 122/69  Pulse: 73 66 75 72  Resp: 17 16 16 17   Temp: 98 F (36.7 C) 97.7 F (36.5 C) 97.8 F (36.6 C) 98.1 F (36.7 C)  TempSrc:  Oral Oral Oral  SpO2: 100% 100%  100%  Weight:      Height:       General: alert, cooperative and no distress Lochia: appropriate Uterine Fundus: firm Incision: N/A DVT Evaluation: No evidence of DVT seen on physical exam. Negative Homan's sign. Labs: Lab Results  Component Value Date   WBC 6.8 09/01/2017   HGB 10.9  (L) 09/01/2017   HCT 33.1 (L) 09/01/2017   MCV 82.3 09/01/2017   PLT 273 09/01/2017   CMP Latest Ref Rng & Units 10/05/2015  Glucose 65 - 99 mg/dL 83  BUN 6 - 20 mg/dL 8  Creatinine 7.820.44 - 9.561.00 mg/dL 2.130.52  Sodium 086135 - 578145 mmol/L 137  Potassium 3.5 - 5.1 mmol/L 4.1  Chloride 101 - 111 mmol/L 107  CO2 22 - 32 mmol/L 20(L)  Calcium 8.9 - 10.3 mg/dL 9.0  Total Protein 6.5 - 8.1 g/dL 6.8  Total Bilirubin 0.3 - 1.2 mg/dL 0.5  Alkaline Phos 38 - 126 U/L 164(H)  AST 15 - 41 U/L 19  ALT 14 - 54 U/L 15    Discharge instruction: per After Visit Summary and "Baby and Me Booklet".  After visit meds:  Allergies as of 09/04/2017   No Known Allergies  Medication List    STOP taking these medications   acetaminophen 325 MG tablet Commonly known as:  TYLENOL   calcium carbonate 500 MG chewable tablet Commonly known as:  TUMS - dosed in mg elemental calcium   nystatin cream Commonly known as:  MYCOSTATIN   ondansetron 4 MG tablet Commonly known as:  ZOFRAN   prenatal multivitamin Tabs tablet     TAKE these medications   ibuprofen 600 MG tablet Commonly known as:  ADVIL,MOTRIN Take 1 tablet (600 mg total) by mouth every 6 (six) hours.       Diet: routine diet  Activity: Advance as tolerated. Pelvic rest for 6 weeks.   Outpatient follow up:6 weeks Follow up Appt: Future Appointments  Date Time Provider Department Center  10/01/2017  8:50 AM Katrinka Blazing, [place], CNM [facility] None   Follow up Visit:No follow-ups on file.  Postpartum contraception: Condoms  Newborn Data: Live born female  Birth Weight: 7 lb 0.3 oz (3184 g) APGAR: 8, 9  Newborn Delivery   Birth date/time:  09/02/2017 02:21:00 Delivery type:  Vaginal, Spontaneous     Baby Feeding: Breast Disposition:home with mother   09/04/2017 Myrene Buddy, MD   I confirm that I have verified the information documented in the resident's note and that I have also personally reperformed the physical exam and  all medical decision making activities.  Sharyon Cable, CNM 09/04/17, 8:48 AM

## 2017-10-01 ENCOUNTER — Ambulatory Visit: Payer: BLUE CROSS/BLUE SHIELD | Admitting: Advanced Practice Midwife

## 2017-10-25 ENCOUNTER — Ambulatory Visit (INDEPENDENT_AMBULATORY_CARE_PROVIDER_SITE_OTHER): Payer: BLUE CROSS/BLUE SHIELD | Admitting: Obstetrics and Gynecology

## 2017-10-25 ENCOUNTER — Encounter: Payer: Self-pay | Admitting: Obstetrics and Gynecology

## 2017-10-25 DIAGNOSIS — K649 Unspecified hemorrhoids: Secondary | ICD-10-CM

## 2017-10-25 NOTE — Progress Notes (Signed)
    Obstetrics/Postpartum Visit  Appointment Date: 10/25/2017  OBGYN Clinic: [facility]  Primary Care Provider: Gaspar ColaBaker, Julia N  Chief Complaint:  Chief Complaint  Patient presents with  . Postpartum Care    History of Present Illness: Julia Acosta is a 32 y.o. Asian [id number] (No LMP recorded.), seen for the above chief complaint. Her past medical history is significant for n/a  She is s/p SVD on 09/02/17 at 39 weeks; she was discharged to home on PPD#2. Pregnancy complicated by n/a.  Complains of nothing  Vaginal bleeding or discharge: No  Breast or formula feeding: breast Intercourse: No  Contraception: condoms PP depression s/s: No  Any bowel or bladder issues: No  Pap smear: no abnormalities (date: 02/2015)  Review of Systems: Positive for hemorroids.   Her 12 point review of systems is negative or as noted in the History of Present Illness.  Patient Active Problem List   Diagnosis Date Noted  . NSVD (normal spontaneous vaginal delivery) 09/04/2017  . Labor and delivery indication for care or intervention 09/01/2017  . Obesity affecting pregnancy, antepartum 02/25/2017  . GBS bacteriuria 02/09/2017  . Supervision of other normal pregnancy, antepartum 02/05/2017    Medications Julia Acosta had no medications administered during this visit. Current Outpatient Medications  Medication Sig Dispense Refill  . ibuprofen (ADVIL,MOTRIN) 600 MG tablet Take 1 tablet (600 mg total) by mouth every 6 (six) hours. (Patient not taking: Reported on 10/25/2017) 30 tablet 0   No current facility-administered medications for this visit.     Allergies Patient has no known allergies.  Physical Exam:  BP 107/60   Pulse 75   Ht 5\' 3"  (1.6 m)   Wt 229 lb 6.4 oz (104.1 kg)   BMI 40.64 kg/m  Body mass index is 40.64 kg/m. General appearance: Well nourished, well developed female in no acute distress.  Cardiovascular: regular rate and rhythm Respiratory:   Normal respiratory  effort Abdomen:  no masses, hernias; diffusely non tender to palpation, non distended Breasts: not examined. Neuro/Psych:  Normal mood and affect.  Skin:  Warm and dry.    PP Depression Screening:  negative  Assessment: Patient is a 32 y.o. [id number] who is 7 weeks post partum from a SVD. She is doing very well with no issues. Plans condoms for contraception   Plan:   1. Status post delivery at term Benign exam No issues Return prn  2. Hemorrhoids Not bothering her Reviewed increasing fiber in diet, taking stool softener as needed Return if worsening or having pain   RTC 6 months for annual/pap   Baldemar LenisK. Meryl Jemaine Prokop, M.D. Attending Obstetrician & Gynecologist, [facility], [facility]

## 2019-12-25 IMAGING — US US MFM OB FOLLOW-UP
1 series · 14 of 28 positions shown · non-contrast
Comparison: none

[Series 1: us mfm ob follow-up · 14 of 40 slices shown]
[im 2/40]
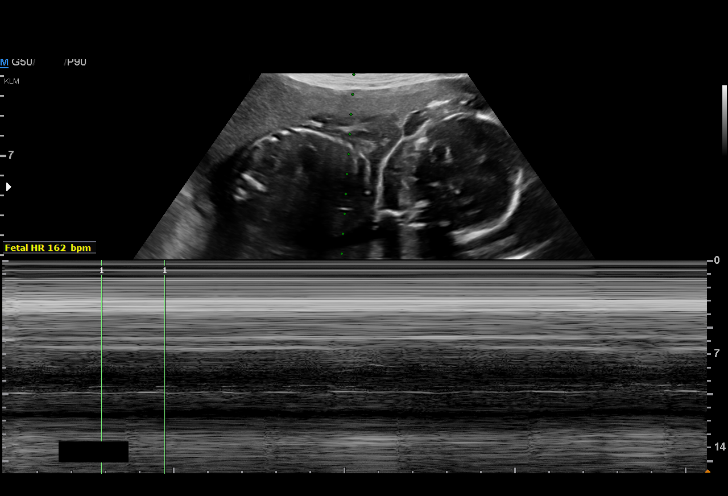
[im 5/40]
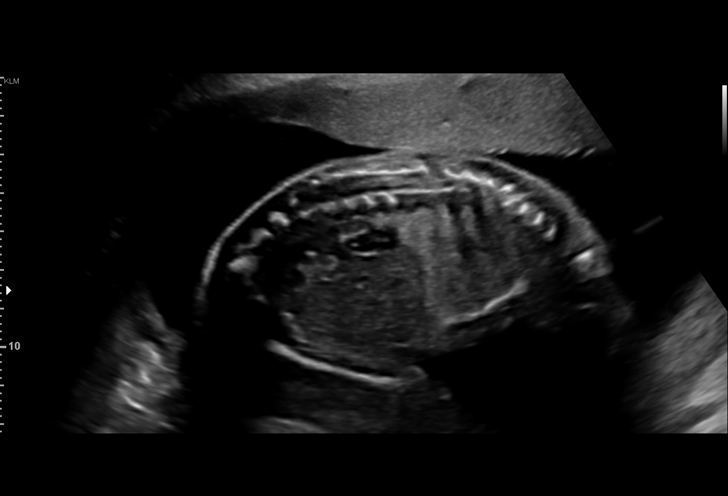
[im 8/40]
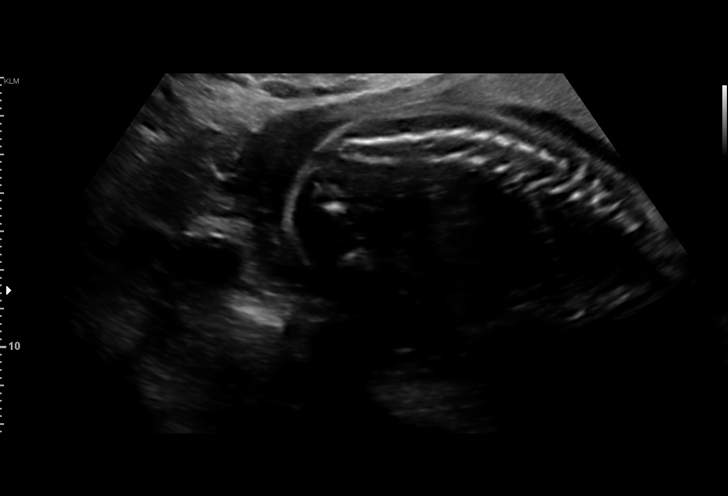
[im 11/40]
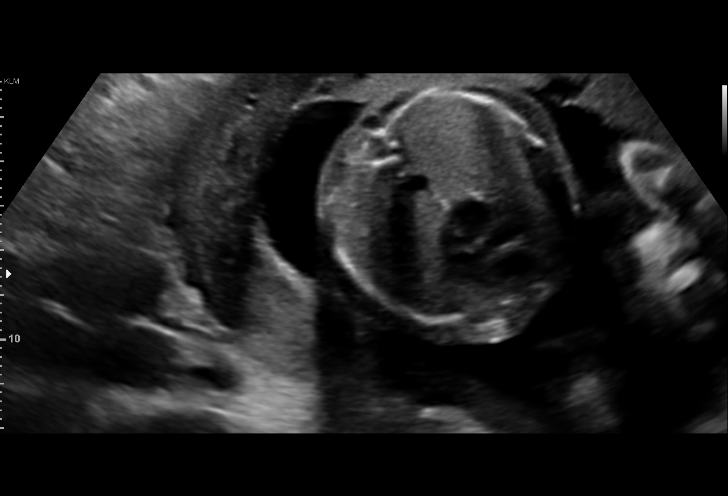
[im 14/40]
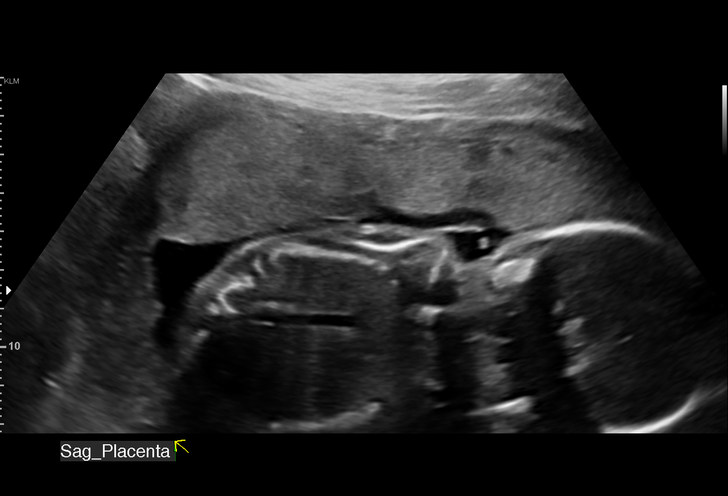
[im 16/40]
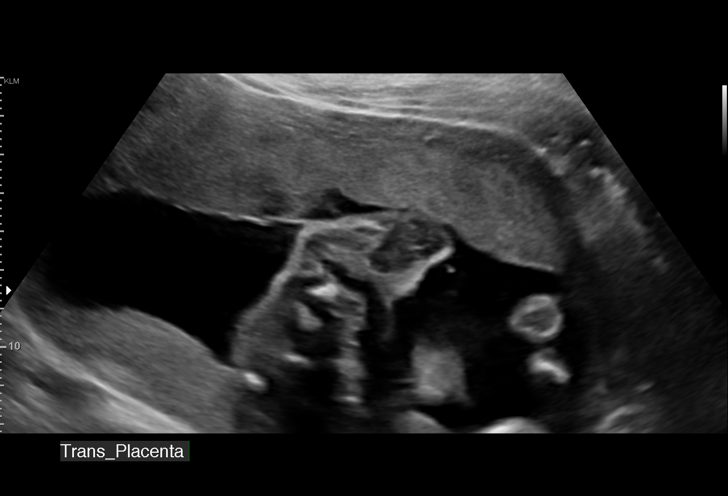
[im 19/40]
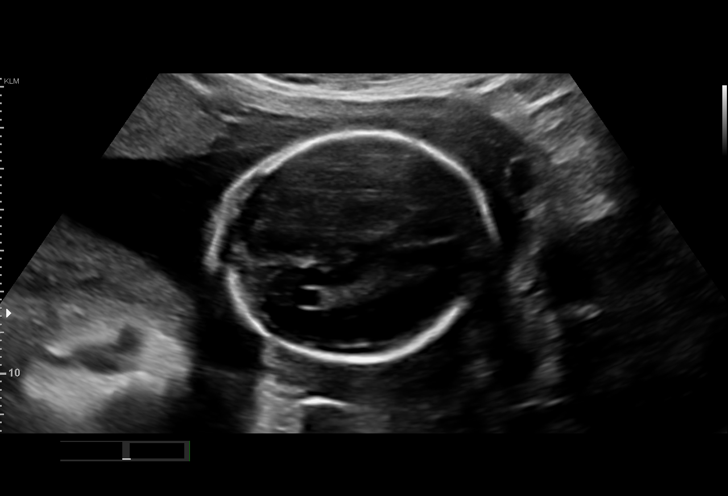
[im 22/40]
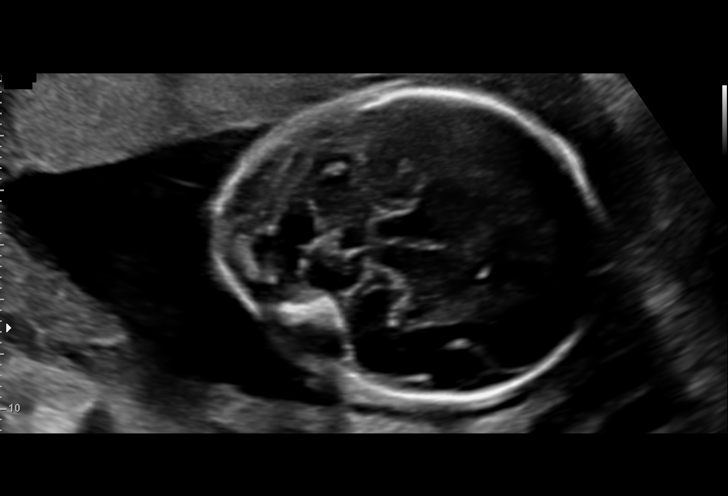
[im 25/40]
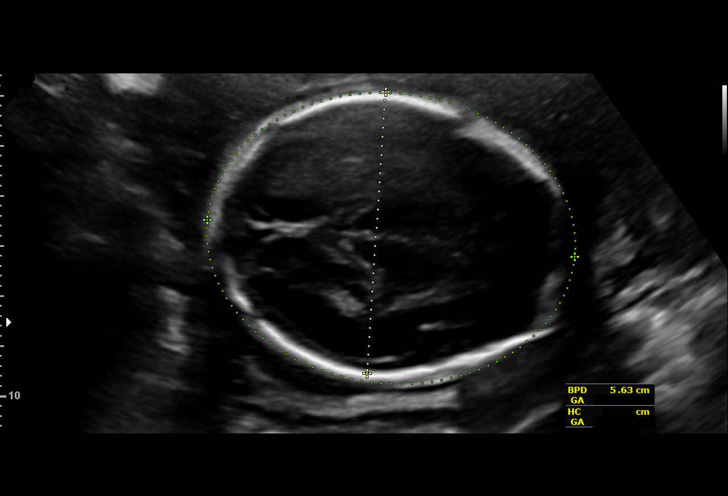
[im 28/40]
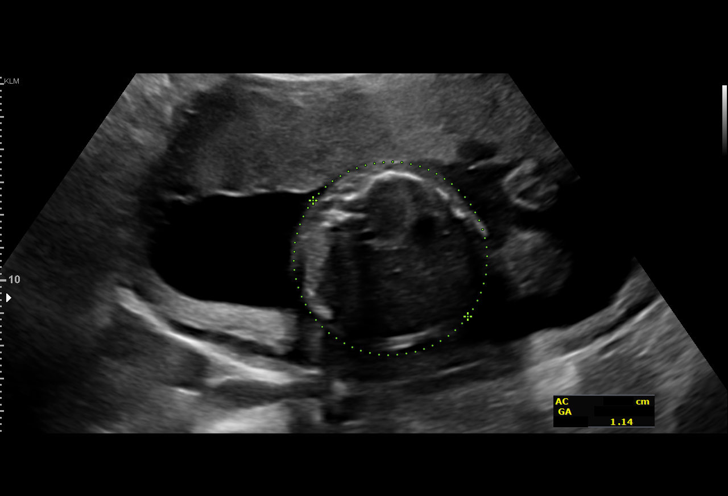
[im 31/40]
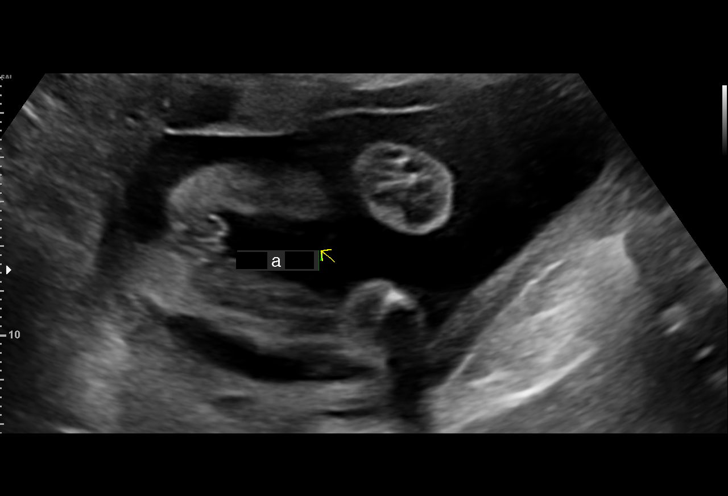
[im 34/40]
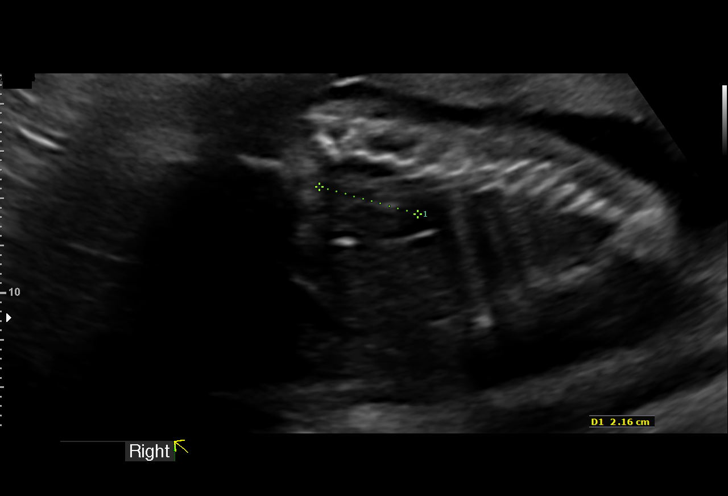
[im 37/40]
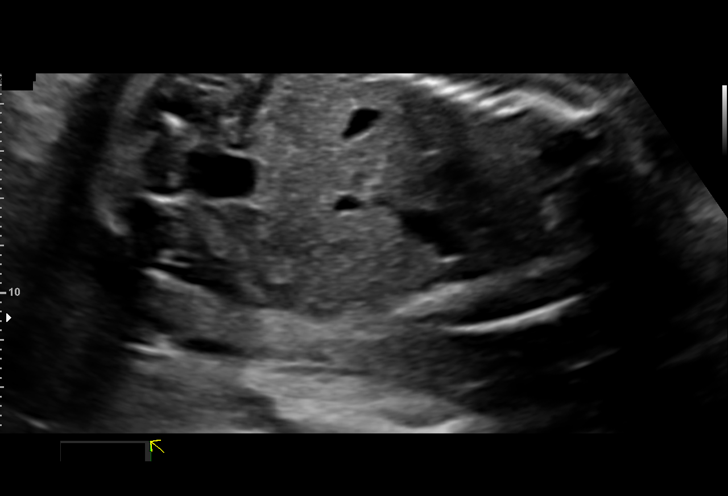
[im 40/40]
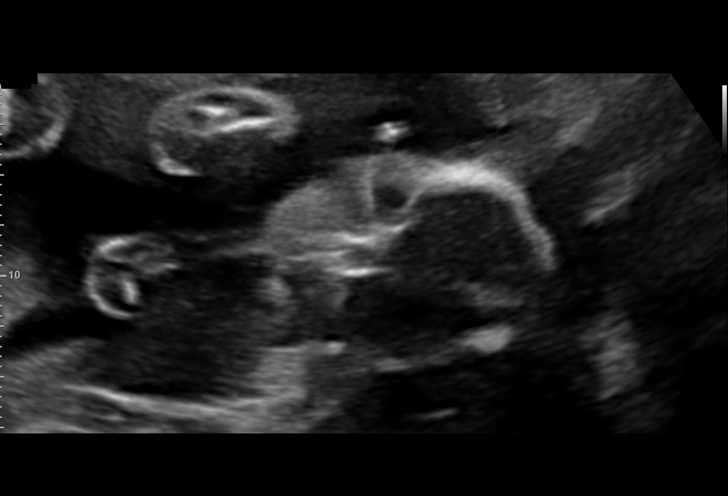

[14 of 28 positions shown; findings below may reference images not displayed]

[REDACTED]

1  ROBERTO LUIZ DOMANSKI           [id number]      [id number]     [id number]
Indications

22 weeks gestation of pregnancy
Antenatal follow-up for nonvisualized fetal
anatomy
Obesity complicating pregnancy, second
trimester
OB History

Blood Type:            Height:  5'3"   Weight (lb):  220       BMI:
Gravidity:    2         Term:   1
Living:       1
Fetal Evaluation

Num Of Fetuses:     1
Fetal Heart         162
Rate(bpm):
Cardiac Activity:   Observed
Presentation:       Cephalic
Placenta:           Anterior, above cervical os
P. Cord Insertion:  Visualized

Amniotic Fluid
AFI FV:      Subjectively within normal limits
Biometry

BPD:      56.2  mm     G. Age:  23w 1d         58  %    CI:           73   %    70 - 86
FL/HC:      19.8   %    19.2 -
HC:      209.1  mm     G. Age:  23w 0d         42  %    HC/AC:      1.11        1.05 -
AC:      187.8  mm     G. Age:  23w 4d         64  %    FL/BPD:     73.8   %    71 - 87
FL:       41.5  mm     G. Age:  23w 4d         60  %    FL/AC:      22.1   %    20 - 24
Est. FW:     596  gm      1 lb 5 oz     62  %
Gestational Age

LMP:           23w 6d        Date:  11/24/16                 EDD:   08/31/17
U/S Today:     23w 2d                                        EDD:   09/04/17
Best:          22w 6d     Det. By:  Early Ultrasound         EDD:   09/07/17
(02/05/17)
Anatomy

Cranium:               Appears normal         Aortic Arch:            Previously seen
Cavum:                 Appears normal         Ductal Arch:            Previously seen
Ventricles:            Appears normal         Diaphragm:              Appears normal
Choroid Plexus:        Appears normal         Stomach:                Appears normal, left
sided
Cerebellum:            Appears normal         Abdomen:                Appears normal
Posterior Fossa:       Appears normal         Abdominal Wall:         Previously seen
Nuchal Fold:           Previously seen        Cord Vessels:           Previously seen
Face:                  Orbits and profile     Kidneys:                Appear normal
previously seen
Lips:                  Previously seen        Bladder:                Appears normal
Thoracic:              Appears normal         Spine:                  Appears normal
Heart:                 Appears normal         Upper Extremities:      Previously seen
(4CH, axis, and situs
RVOT:                  Previously seen        Lower Extremities:      Previously seen
LVOT:                  Previously seen

Other:  Fetus appears to be a female. Heels previously visualized.
Technically difficult due to maternal habitus and fetal position.
Cervix Uterus Adnexa

Cervix
Length:            3.6  cm.
Normal appearance by transabdominal scan.
Impression

SIUP at 66w0d
active fetus
EFW 62nd%'le
no dysmorphic features demonstrated
survey is now complete
no previa
Recommendations

Follow up as clinically indicated.

## 2020-05-25 ENCOUNTER — Ambulatory Visit (INDEPENDENT_AMBULATORY_CARE_PROVIDER_SITE_OTHER): Payer: BC Managed Care – PPO | Admitting: Obstetrics & Gynecology

## 2020-05-25 ENCOUNTER — Other Ambulatory Visit: Payer: Self-pay

## 2020-05-25 ENCOUNTER — Encounter: Payer: Self-pay | Admitting: Obstetrics & Gynecology

## 2020-05-25 VITALS — BP 133/89 | HR 76 | Ht 63.0 in | Wt 225.0 lb

## 2020-05-25 DIAGNOSIS — N764 Abscess of vulva: Secondary | ICD-10-CM | POA: Diagnosis not present

## 2020-05-25 NOTE — Progress Notes (Signed)
GYNECOLOGY  VISIT  CC:   Possible bartholin's gland abscess  HPI: 35 y.o. G26P2002 Married Other or two or more races female here for possible bartholin's gland abscess that she thinks ruptured yesterday.  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  She recently returned from [place] about 7 days ago and the bartholin's abscess seemed to start on the flight home.  GYNECOLOGIC HISTORY: Patient's last menstrual period was 04/27/2020. Contraception: none  Patient Active Problem List   Diagnosis Date Noted  . Polycystic ovaries 05/26/2020  . GBS bacteriuria 02/09/2017    Past Medical History:  Diagnosis Date  . Bartholin cyst   . History of UTI     Past Surgical History:  Procedure Laterality Date  . ABCESS DRAINAGE     Bartholin cyst    MEDS:   No current outpatient medications on file prior to visit.   No current facility-administered medications on file prior to visit.    ALLERGIES: Patient has no known allergies.  Family History  Problem Relation Age of Onset  . Diabetes Mother   . Hypertension Mother   . Hypertension Father   . Diabetes Father   . Hyperlipidemia Father   . Cancer Neg Hx   . Stroke Neg Hx     SH:  Married, non smoker  Review of Systems  Constitutional: Negative for fever.  Genitourinary: Positive for genital sores (possible abscess). Negative for dysuria, pelvic pain and vaginal pain.    PHYSICAL EXAMINATION:    BP 133/89   Pulse 76   Ht 5\' 3"  (1.6 m)   Wt 225 lb (102.1 kg)   LMP 04/27/2020   BMI 39.86 kg/m     Physical Exam Exam conducted with a chaperone present.  Constitutional:      Appearance: Normal appearance.  Genitourinary:   Lymphadenopathy:     Lower Body: No right inguinal adenopathy. No left inguinal adenopathy.  Skin:    General: Skin is warm and dry.  Neurological:     General: No focal deficit present.     Mental Status: She is alert.    Chaperone, 04/29/2020, RN, was present for  exam.  Assessment/Plan:  1. Vulvar abscess, per hx is bartholin's, but on exam today does not look like it is in the correct location -d/w pt physical exam findings.  There is no I&D that is needed and there is minimal drainage and essentially no erythema.  Do not feel antibiotics are needed.  Would be helpful to see if occurs again before it ruptures/drains.  Did not obtain culture today as there is minimal on physical exam.  Warm water/epson salt bath/warm compresses discussed as prevention and or early intervention as well.

## 2020-05-26 ENCOUNTER — Encounter: Payer: Self-pay | Admitting: Obstetrics & Gynecology

## 2020-05-26 DIAGNOSIS — E282 Polycystic ovarian syndrome: Secondary | ICD-10-CM | POA: Insufficient documentation

## 2020-05-26 HISTORY — DX: Polycystic ovarian syndrome: E28.2

## 2020-06-17 ENCOUNTER — Other Ambulatory Visit (HOSPITAL_COMMUNITY)
Admission: RE | Admit: 2020-06-17 | Discharge: 2020-06-17 | Disposition: A | Discharge status: Home / Self Care | Payer: BC Managed Care – PPO | Source: Ambulatory Visit | Attending: Obstetrics & Gynecology | Admitting: Obstetrics & Gynecology

## 2020-06-17 ENCOUNTER — Ambulatory Visit (INDEPENDENT_AMBULATORY_CARE_PROVIDER_SITE_OTHER): Payer: BC Managed Care – PPO | Admitting: Obstetrics & Gynecology

## 2020-06-17 ENCOUNTER — Other Ambulatory Visit: Payer: Self-pay

## 2020-06-17 ENCOUNTER — Encounter: Payer: Self-pay | Admitting: Obstetrics & Gynecology

## 2020-06-17 VITALS — BP 139/91 | HR 74 | Ht 63.0 in | Wt 225.0 lb

## 2020-06-17 DIAGNOSIS — Z01419 Encounter for gynecological examination (general) (routine) without abnormal findings: Secondary | ICD-10-CM | POA: Diagnosis present

## 2020-06-17 NOTE — Patient Instructions (Addendum)
Preventive Care 21-33 Years Old, Female Preventive care refers to lifestyle choices and visits with your health care provider that can promote health and wellness. This includes:  A yearly physical exam. This is also called an annual wellness visit.  Regular dental and eye exams.  Immunizations.  Screening for certain conditions.  Healthy lifestyle choices, such as: ? Eating a healthy diet. ? Getting regular exercise. ? Not using drugs or products that contain nicotine and tobacco. ? Limiting alcohol use. What can I expect for my preventive care visit? Physical exam Your health care provider may check your:  Height and weight. These may be used to calculate your BMI (body mass index). BMI is a measurement that tells if you are at a healthy weight.  Heart rate and blood pressure.  Body temperature.  Skin for abnormal spots. Counseling Your health care provider may ask you questions about your:  Past medical problems.  Family's medical history.  Alcohol, tobacco, and drug use.  Emotional well-being.  Home life and relationship well-being.  Sexual activity.  Diet, exercise, and sleep habits.  Work and work environment.  Access to firearms.  Method of birth control.  Menstrual cycle.  Pregnancy history. What immunizations do I need? Vaccines are usually given at various ages, according to a schedule. Your health care provider will recommend vaccines for you based on your age, medical history, and lifestyle or other factors, such as travel or where you work.   What tests do I need? Blood tests  Lipid and cholesterol levels. These may be checked every 5 years starting at age 20.  Hepatitis C test.  Hepatitis B test. Screening  Diabetes screening. This is done by checking your blood sugar (glucose) after you have not eaten for a while (fasting).  STD (sexually transmitted disease) testing, if you are at risk.  BRCA-related cancer screening. This may be  done if you have a family history of breast, ovarian, tubal, or peritoneal cancers.  Pelvic exam and Pap test. This may be done every 3 years starting at age 21. Starting at age 30, this may be done every 5 years if you have a Pap test in combination with an HPV test. Talk with your health care provider about your test results, treatment options, and if necessary, the need for more tests.   Follow these instructions at home: Eating and drinking  Eat a healthy diet that includes fresh fruits and vegetables, whole grains, lean protein, and low-fat dairy products.  Take vitamin and mineral supplements as recommended by your health care provider.  Do not drink alcohol if: ? Your health care provider tells you not to drink. ? You are pregnant, may be pregnant, or are planning to become pregnant.  If you drink alcohol: ? Limit how much you have to 0-1 drink a day. ? Be aware of how much alcohol is in your drink. In the [place] one drink equals one 12 oz bottle of beer (355 mL), one 5 oz glass of wine (148 mL), or one 1 oz glass of hard liquor (44 mL).   Lifestyle  Take daily care of your teeth and gums. Brush your teeth every morning and night with fluoride toothpaste. Floss one time each day.  Stay active. Exercise for at least 30 minutes 5 or more days each week.  Do not use any products that contain nicotine or tobacco, such as cigarettes, e-cigarettes, and chewing tobacco. If you need help quitting, ask your health care provider.  Do not   use drugs.  If you are sexually active, practice safe sex. Use a condom or other form of protection to prevent STIs (sexually transmitted infections).  If you do not wish to become pregnant, use a form of birth control. If you plan to become pregnant, see your health care provider for a prepregnancy visit.  Find healthy ways to cope with stress, such as: ? Meditation, yoga, or listening to music. ? Journaling. ? Talking to a trusted  person. ? Spending time with friends and family. Safety  Always wear your seat belt while driving or riding in a vehicle.  Do not drive: ? If you have been drinking alcohol. Do not ride with someone who has been drinking. ? When you are tired or distracted. ? While texting.  Wear a helmet and other protective equipment during sports activities.  If you have firearms in your house, make sure you follow all gun safety procedures.  Seek help if you have been physically or sexually abused. What's next?  Go to your health care provider once a year for an annual wellness visit.  Ask your health care provider how often you should have your eyes and teeth checked.  Stay up to date on all vaccines. This information is not intended to replace advice given to you by your health care provider. Make sure you discuss any questions you have with your health care provider. Document Revised: 12/27/2019 Document Reviewed: 01/09/2018 Elsevier Patient Education  [place].    Female Infertility  Female infertility refers to a woman's inability to get pregnant (conceive) after a year of having sex regularly (or after 6 months in women over age 23) without using birth control. Infertility can also mean that a woman is not able to carry a pregnancy to full term. Both women and men can have fertility problems. What are the causes? This condition may be caused by:  Problems with reproductive organs. Infertility can result if a woman: ? Has an abnormally short cervix or a cervix that does not remain closed during a pregnancy. ? Has a blockage or scarring in the fallopian tubes. ? Has an abnormally shaped uterus. ? Has uterine fibroids. This is a benign mass of tissue or muscle (tumor) that can develop in the uterus. ? Is not ovulating in a regular way.  Certain medical conditions. These may include: ? Polycystic ovary syndrome (PCOS). This is a hormonal disorder that can cause small cysts  to grow on the ovaries. This is the most common cause of infertility in women. ? Endometriosis. This is a condition in which the tissue that lines the uterus (endometrium) grows outside of its normal location. ? Cancer and cancer treatments, such as chemotherapy or radiation. ? Premature ovarian failure. This is when ovaries stop producing eggs and hormones before age 13. ? Sexually transmitted diseases, such as chlamydia or gonorrhea. ? Autoimmune disorders. These are disorders in which the body's defense system (immune system) attacks normal, healthy cells. Infertility can be linked to more than one cause. For some women, the cause of infertility is not known (unexplained infertility). What increases the risk?  Age. A woman's fertility declines with age, especially after her mid-75s.  Being underweight or overweight.  Drinking too much alcohol.  Using drugs such as anabolic steroids, cocaine, and marijuana.  Exercising excessively.  Being exposed to environmental toxins, such as radiation, pesticides, and certain chemicals. What are the signs or symptoms? The main sign of infertility in women is the inability to get  pregnant or carry a pregnancy to full term. How is this diagnosed? This condition may be diagnosed by:  Checking whether you are ovulating each month. The tests may include: ? Blood tests to check hormone levels. ? An ultrasound of the ovaries. ? Taking a small tissue that lines the uterus and checking it under a microscope (endometrial biopsy).  Doing additional tests. This is done if ovulation is normal. Tests may include: ? Hysterosalpingography. This X-ray test can show the shape of the uterus and whether the fallopian tubes are open. ? Laparoscopy. This test uses a lighted tube (laparoscope) to look for problems in the fallopian tubes and other organs. ? Transvaginal ultrasound. This imaging test is used to check for abnormalities in the uterus and  ovaries. ? Hysteroscopy. This test uses a lighted tube to check for problems in the cervix and the uterus. To be diagnosed with infertility, both partners will have a physical exam. Both partners will also have an extensive medical and sexual history taken. Additional tests may be done. How is this treated? Treatment depends on the cause of infertility. Most cases of infertility in women are treated with medicine or surgery.  Women may take medicine to: ? Correct ovulation problems. ? Treat other health conditions.  Surgery may be done to: ? Repair damage to the ovaries, fallopian tubes, cervix, or uterus. ? Remove growths from the uterus. ? Remove scar tissue from the uterus, pelvis, or other organs. Assisted reproductive technology (ART) Assisted reproductive technology (ART) refers to all treatments and procedures that combine eggs and sperm outside the body to try to help a couple conceive. ART is often combined with fertility drugs to stimulate ovulation. Sometimes ART is done using eggs retrieved from another woman's body (donor eggs) or from previously frozen fertilized eggs (embryos). There are different types of ART. These include:  Intrauterine insemination (IUI). A long, thin tube is used to place sperm directly into a woman's uterus. This procedure: ? Is effective for infertility caused by sperm problems, including low sperm count and low motility. ? Can be used in combination with fertility drugs.  In vitro fertilization (IVF). This is done when a woman's fallopian tubes are blocked or when a man has low sperm count. In this procedure: ? Fertility drugs are used to stimulate the ovaries to produce multiple eggs. ? Once mature, these eggs are removed from the body and combined with the sperm to be fertilized. ? The fertilized eggs are then placed into the woman's uterus. Follow these instructions at home:  Take over-the-counter and prescription medicines only as told by your  health care provider.  Do not use any products that contain nicotine or tobacco, such as cigarettes and e-cigarettes. If you need help quitting, ask your health care provider.  If you drink alcohol, limit how much you have to 1 drink a day.  Make dietary changes to lose weight or maintain a healthy weight. Work with your health care provider and a dietitian to set a weight-loss goal that is healthy and reasonable for you.  Seek support from a counselor or support group to talk about your concerns related to infertility. Couples counseling may be helpful for you and your partner.  Practice stress reduction techniques that work well for you, such as regular physical activity, meditation, or deep breathing.  Keep all follow-up visits as told by your health care provider. This is important. Contact a health care provider if you:  Feel that stress is interfering with your  life and relationships.  Have side effects from treatments for infertility. Summary  Female infertility refers to a woman's inability to get pregnant (conceive) after a year of having sex regularly (or after 6 months in women over age 5) without using birth control.  To be diagnosed with infertility, both partners will have a physical exam. Both partners will also have an extensive medical and sexual history taken.  Seek support from a counselor or support group to talk about your concerns related to infertility. Couples counseling may be helpful for you and your partner. This information is not intended to replace advice given to you by your health care provider. Make sure you discuss any questions you have with your health care provider. Document Revised: 02/11/2020 Document Reviewed: 01/01/2020 Elsevier Patient Education  [place].

## 2020-06-17 NOTE — Progress Notes (Signed)
GYNECOLOGY ANNUAL PREVENTATIVE CARE ENCOUNTER NOTE  History:     Julia Acosta is a 35 y.o. G65P2002 female here for a routine annual gynecologic exam.  Current complaints: has been trying to conceive her third child for a year without success. Has regular periods, uses ovulation kits that show she ovulates.  Reports having gained considerable weight in the last year.   Denies abnormal vaginal bleeding, discharge, pelvic pain, problems with intercourse or other gynecologic concerns.    Gynecologic History Patient's last menstrual period was 06/01/2020. Contraception: condoms Last Pap: 03/14/2015. Results were: normal   Obstetric History OB History  Gravida Para Term Preterm AB Living  2 2 2     2   SAB IAB Ectopic Multiple Live Births        0 2    # Outcome Date GA Lbr Len/2nd Weight Sex Delivery Anes PTL Lv  2 Term 09/02/17 [redacted]w[redacted]d 07:00 / 00:21 7 lb 0.3 oz (3.184 kg) F Vag-Spont None  LIV  1 Term 10/06/15 [redacted]w[redacted]d 15:32 / 01:35 6 lb 8.8 oz (2.97 kg) M Vag-Spont EPI, Local  LIV    Past Medical History:  Diagnosis Date  . Bartholin cyst   . History of UTI     Past Surgical History:  Procedure Laterality Date  . ABCESS DRAINAGE     Bartholin cyst    No current outpatient medications on file prior to visit.   No current facility-administered medications on file prior to visit.    No Known Allergies  Social History:  reports that she has never smoked. She has never used smokeless tobacco. She reports that she does not drink alcohol and does not use drugs.  Family History  Problem Relation Age of Onset  . Diabetes Mother   . Hypertension Mother   . Hypertension Father   . Diabetes Father   . Hyperlipidemia Father   . Cancer Neg Hx   . Stroke Neg Hx     The following portions of the patient's history were reviewed and updated as appropriate: allergies, current medications, past family history, past medical history, past social history, past surgical history and  problem list.  Review of Systems Pertinent items noted in HPI and remainder of comprehensive ROS otherwise negative.  Physical Exam:  BP (!) 139/91   Pulse 74   Ht 5\' 3"  (1.6 m)   Wt 225 lb (102.1 kg)   LMP 06/01/2020   BMI 39.86 kg/m  CONSTITUTIONAL: Well-developed, well-nourished female in no acute distress.  HENT:  Normocephalic, atraumatic, External right and left ear normal.  EYES: Conjunctivae and EOM are normal. Pupils are equal, round, and reactive to light. No scleral icterus.  NECK: Normal range of motion, supple, no masses.  Normal thyroid.  SKIN: Skin is warm and dry. No rash noted. Not diaphoretic. No erythema. No pallor. MUSCULOSKELETAL: Normal range of motion. No tenderness.  No cyanosis, clubbing, or edema.   NEUROLOGIC: Alert and oriented to person, place, and time. Normal reflexes, muscle tone coordination. No cranial nerve deficit noted. PSYCHIATRIC: Normal mood and affect. Normal behavior. Normal judgment and thought content. CARDIOVASCULAR: Normal heart rate noted, regular rhythm RESPIRATORY: Clear to auscultation bilaterally. Effort and breath sounds normal, no problems with respiration noted. BREASTS: Symmetric in size. No masses, tenderness, skin changes, nipple drainage, or lymphadenopathy bilaterally.  Performed in the presence of a chaperone. ABDOMEN: Soft, obese, no distention appreciated.  No tenderness, rebound or guarding.  PELVIC: Normal appearing external genitalia and urethral meatus; normal appearing  vaginal mucosa and cervix.  Normal appearing discharge.  Pap smear obtained.  Unable to palpate uterus or adnexa secondary to habitus.  Performed in the presence of a chaperone.   Assessment and Plan:      1. Well woman exam with routine gynecological exam - Cytology - PAP( [place]) Will follow up results of pap smear and manage accordingly. Recommended weight loss, even a 10-15% loss can help with fertility issues.  Also offered referral to  infertility specialist at any time, declined for now. Will continue to monitor. Routine preventative health maintenance measures emphasized. Please refer to After Visit Summary for other counseling recommendations.      Jaynie Collins, MD, FACOG Obstetrician & Gynecologist, [facility] for [employer], [facility]

## 2020-06-22 LAB — CYTOLOGY - PAP
Comment: NEGATIVE
Diagnosis: NEGATIVE
High risk HPV: NEGATIVE

## 2021-06-13 DIAGNOSIS — Z111 Encounter for screening for respiratory tuberculosis: Secondary | ICD-10-CM | POA: Diagnosis not present

## 2021-06-26 DIAGNOSIS — J039 Acute tonsillitis, unspecified: Secondary | ICD-10-CM | POA: Diagnosis not present

## 2021-07-03 DIAGNOSIS — R5383 Other fatigue: Secondary | ICD-10-CM | POA: Diagnosis not present

## 2021-07-03 DIAGNOSIS — Z0001 Encounter for general adult medical examination with abnormal findings: Secondary | ICD-10-CM | POA: Diagnosis not present

## 2021-07-03 DIAGNOSIS — G609 Hereditary and idiopathic neuropathy, unspecified: Secondary | ICD-10-CM | POA: Diagnosis not present

## 2021-07-03 DIAGNOSIS — R1013 Epigastric pain: Secondary | ICD-10-CM | POA: Diagnosis not present

## 2021-12-15 DIAGNOSIS — E669 Obesity, unspecified: Secondary | ICD-10-CM | POA: Diagnosis not present

## 2021-12-15 DIAGNOSIS — G8929 Other chronic pain: Secondary | ICD-10-CM | POA: Diagnosis not present

## 2021-12-15 DIAGNOSIS — J45909 Unspecified asthma, uncomplicated: Secondary | ICD-10-CM | POA: Diagnosis not present

## 2021-12-15 DIAGNOSIS — M79606 Pain in leg, unspecified: Secondary | ICD-10-CM | POA: Diagnosis not present

## 2021-12-29 DIAGNOSIS — R1013 Epigastric pain: Secondary | ICD-10-CM | POA: Diagnosis not present

## 2021-12-29 DIAGNOSIS — R5383 Other fatigue: Secondary | ICD-10-CM | POA: Diagnosis not present

## 2021-12-29 DIAGNOSIS — G609 Hereditary and idiopathic neuropathy, unspecified: Secondary | ICD-10-CM | POA: Diagnosis not present

## 2021-12-29 DIAGNOSIS — Z0001 Encounter for general adult medical examination with abnormal findings: Secondary | ICD-10-CM | POA: Diagnosis not present

## 2022-03-04 DIAGNOSIS — W57XXXA Bitten or stung by nonvenomous insect and other nonvenomous arthropods, initial encounter: Secondary | ICD-10-CM | POA: Diagnosis not present

## 2022-03-04 DIAGNOSIS — L03116 Cellulitis of left lower limb: Secondary | ICD-10-CM | POA: Diagnosis not present

## 2022-05-14 NOTE — L&D Delivery Note (Signed)
Labor Progress Julia Acosta is a 37 y.o. female [phone] with IUP at [redacted]w[redacted]d admitted for IOL for GHTN .  She progressed with augmentation to complete and pushed less than 10 minutes to deliver.  Cord clamping delayed by several minutes then clamped by CNM and cut by pt husband.  Placenta intact and spontaneous, bleeding minimal.  Intact perineum without laceration repaired without difficulty.  Mom and baby stable prior to transfer to postpartum. She plans on breastfeeding. She requests condoms for birth control.    Delivery Note At 8:24 AM a viable and healthy female was delivered via Vaginal, Spontaneous (Presentation: Left Occiput Anterior).  APGAR: 8, 9; weight 6 lb 0.7 oz (2740 g).   Placenta status: Expressed, Intact.  Cord: 2 vessels with the following complications: None.    Anesthesia: Epidural Episiotomy: None Lacerations: 2nd degree;Perineal Suture Repair:  n/a Est. Blood Loss (mL): 76  Mom to postpartum.  Baby to Couplet care / Skin to Skin.  Sharen Counter 03/29/2023, 9:57 AM

## 2022-09-18 ENCOUNTER — Ambulatory Visit (INDEPENDENT_AMBULATORY_CARE_PROVIDER_SITE_OTHER): Payer: BC Managed Care – PPO

## 2022-09-18 ENCOUNTER — Other Ambulatory Visit (INDEPENDENT_AMBULATORY_CARE_PROVIDER_SITE_OTHER): Payer: BC Managed Care – PPO

## 2022-09-18 VITALS — BP 135/92 | HR 92 | Wt 232.0 lb

## 2022-09-18 DIAGNOSIS — O09521 Supervision of elderly multigravida, first trimester: Secondary | ICD-10-CM | POA: Diagnosis not present

## 2022-09-18 DIAGNOSIS — O09529 Supervision of elderly multigravida, unspecified trimester: Secondary | ICD-10-CM

## 2022-09-18 DIAGNOSIS — Z3481 Encounter for supervision of other normal pregnancy, first trimester: Secondary | ICD-10-CM

## 2022-09-18 DIAGNOSIS — R030 Elevated blood-pressure reading, without diagnosis of hypertension: Secondary | ICD-10-CM | POA: Diagnosis not present

## 2022-09-18 DIAGNOSIS — Z3480 Encounter for supervision of other normal pregnancy, unspecified trimester: Secondary | ICD-10-CM | POA: Insufficient documentation

## 2022-09-18 DIAGNOSIS — Z1339 Encounter for screening examination for other mental health and behavioral disorders: Secondary | ICD-10-CM | POA: Diagnosis not present

## 2022-09-18 DIAGNOSIS — Z6841 Body Mass Index (BMI) 40.0 and over, adult: Secondary | ICD-10-CM | POA: Diagnosis not present

## 2022-09-18 DIAGNOSIS — Z3A10 10 weeks gestation of pregnancy: Secondary | ICD-10-CM

## 2022-09-18 MED ORDER — ASPIRIN 81 MG PO TBEC: 81.0000 mg | DELAYED_RELEASE_TABLET | Freq: Every day | ORAL | 12 refills | Status: DC

## 2022-09-18 NOTE — Progress Notes (Signed)
Subjective:   Julia Acosta is a 37 y.o. G3P2002 at [redacted]w[redacted]d by LMP being seen today for her first obstetrical visit.  Her obstetrical history is significant for obesity and has GBS bacteriuria; Polycystic ovaries; and Supervision of other normal pregnancy, antepartum on their problem list.. Patient does intend to breast feed. Pregnancy history fully reviewed.  Patient reports nausea.  HISTORY: OB History  Gravida Para Term Preterm AB Living  3 2 2  0 0 2  SAB IAB Ectopic Multiple Live Births  0 0 0 0 2    # Outcome Date GA Lbr Len/2nd Weight Sex Delivery Anes PTL Lv  3 Current           2 Term 09/02/17 [redacted]w[redacted]d 07:00 / 00:21 7 lb 0.3 oz (3.184 kg) F Vag-Spont None  LIV     Name: SHAQUANNA, POPOVICH     Apgar1: 8  Apgar5: 9  1 Term 10/06/15 103w0d 15:32 / 01:35 6 lb 8.8 oz (2.97 kg) M Vag-Spont EPI, Local  LIV     Name: Eddinger,BOY Aideliz     Apgar1: 5  Apgar5: 8   Past Medical History:  Diagnosis Date   Bartholin cyst    History of UTI    Past Surgical History:  Procedure Laterality Date   ABCESS DRAINAGE     Bartholin cyst   Family History  Problem Relation Age of Onset   Diabetes Mother    Hypertension Mother    Hypertension Father    Diabetes Father    Hyperlipidemia Father    Cancer Neg Hx    Stroke Neg Hx    Social History   Tobacco Use   Smoking status: Never   Smokeless tobacco: Never  Vaping Use   Vaping Use: Never used  Substance Use Topics   Alcohol use: No    Alcohol/week: 0.0 standard drinks of alcohol   Drug use: No   No Known Allergies Current Outpatient Medications on File Prior to Visit  Medication Sig Dispense Refill   Prenatal Vit-Fe Fumarate-FA (MULTIVITAMIN-PRENATAL) 27-0.8 MG TABS tablet Take 1 tablet by mouth daily at 12 noon.     No current facility-administered medications on file prior to visit.   Indications for ASA therapy (per uptodate) One of the following: Previous pregnancy with preeclampsia, especially early onset and  with an adverse outcome No Multifetal gestation No Chronic hypertension No Type 1 or 2 diabetes mellitus No Chronic kidney disease No Autoimmune disease (antiphospholipid syndrome, systemic lupus erythematosus) No  Two or more of the following: Nulliparity No Obesity (body mass index >30 kg/m2) Yes Family history of preeclampsia in mother or sister No Age ?35 years Yes Sociodemographic characteristics (African American race, low socioeconomic level) Yes Personal risk factors (eg, previous pregnancy with low birth weight or small for gestational age infant, previous adverse pregnancy outcome [eg, stillbirth], interval >10 years between pregnancies) No  Indications for early 1 hour GTT (per uptodate)  BMI >25 (>23 in Asian women) AND one of the following  Gestational diabetes mellitus in a previous pregnancy No Glycated hemoglobin ?5.7 percent (39 mmol/mol), impaired glucose tolerance, or impaired fasting glucose on previous testing No First-degree relative with diabetes No High-risk race/ethnicity (eg, African American, Latino, Native American, [place] American, Pacific Islander) Yes History of cardiovascular disease No Hypertension or on therapy for hypertension No High-density lipoprotein cholesterol level <35 mg/dL (1.61 mmol/L) and/or a triglyceride level >250 mg/dL (0.96 mmol/L) No Polycystic ovary syndrome No Physical inactivity No Other clinical condition associated  with insulin resistance (eg, severe obesity, acanthosis nigricans) No Previous birth of an infant weighing ?4000 g No Previous stillbirth of unknown cause No Exam   Vitals:   09/18/22 1323  BP: (!) 135/92  Pulse: 92  Weight: 232 lb (105.2 kg)      Uterus:     Pelvic Exam: Perineum: deferred   Vulva: deferred   Vagina:  deferred   Cervix: deferred   Adnexa: deferred   Bony Pelvis: deferred  System: General: well-developed, well-nourished female in no acute distress   Breast:  normal appearance, no  masses or tenderness   Skin: normal coloration and turgor, no rashes   Neurologic: oriented, normal, negative, normal mood   Extremities: normal strength, tone, and muscle mass, ROM of all joints is normal   HEENT PERRLA, extraocular movement intact and sclera clear, anicteric   Mouth/Teeth mucous membranes moist, pharynx normal without lesions and dental hygiene good   Neck supple and no masses   Cardiovascular: regular rate and rhythm   Respiratory:  no respiratory distress, normal breath sounds   Abdomen: soft, non-tender; bowel sounds normal; no masses,  no organomegaly     Assessment:   Pregnancy: [id number] Patient Active Problem List   Diagnosis Date Noted   Supervision of other normal pregnancy, antepartum 09/18/2022   Polycystic ovaries 05/26/2020   GBS bacteriuria 02/09/2017     Plan:  1. Supervision of other normal pregnancy, antepartum - NOB. Welcomed patient to practice - Having some nausea but declines medications - Pap up to date - Reviewed practice model - Recommend bASA starting at 12 weeks. Rx sent today  - US OB Limited; Future - [place] MFM OB DETAIL +14 WK; Future - Hemoglobin A1c - Enroll Patient in PreNatal Babyscripts - Comp Met (CMET) - Protein / creatinine ratio, urine - Pregnancy, Initial Screen  2. [redacted] weeks gestation of pregnancy   3. Elevated BP without diagnosis of hypertension - Baseline labs obtained - Continue to monitor  4. BMI 40.0-44.9, adult (HCC) - Recommend TWG 11-20lbs  5. Antepartum multigravida of advanced maternal age - Will be 66 at time of delivery   Initial labs drawn. Continue prenatal vitamins. Discussed and offered genetic screening options, including Quad screen/AFP, NIPS testing, and option to decline testing. Benefits/risks/alternatives reviewed. Pt aware that anatomy US is form of genetic screening with lower accuracy in detecting trisomies than blood work.  Pt chooses/declines genetic screening today. NIPS:  ordered. Ultrasound discussed; fetal anatomic survey: ordered. Problem list reviewed and updated. The nature of [facility] with multiple MDs and other Advanced Practice Providers was explained to patient; also emphasized that residents, students are part of our team. Routine obstetric precautions reviewed. Return in about 4 weeks (around 10/16/2022) for ROB.   Brand Males, CNM 09/18/22 4:35 PM

## 2022-09-18 NOTE — Patient Instructions (Signed)

## 2022-09-18 NOTE — Progress Notes (Signed)
Last pap 2/22

## 2022-09-19 LAB — HEMOGLOBIN A1C
Est. average glucose Bld gHb Est-mCnc: 108 mg/dL
Hgb A1c MFr Bld: 5.4 % (ref 4.8–5.6)

## 2022-09-20 LAB — PREGNANCY, INITIAL SCREEN
Antibody Screen: NEGATIVE
Basophils Absolute: 0 10*3/uL (ref 0.0–0.2)
Basos: 1 %
Bilirubin, UA: NEGATIVE
Chlamydia trachomatis, NAA: NEGATIVE
EOS (ABSOLUTE): 0.1 10*3/uL (ref 0.0–0.4)
Eos: 2 %
Glucose, UA: NEGATIVE
HCV Ab: NONREACTIVE
HIV Screen 4th Generation wRfx: NONREACTIVE
Hematocrit: 35.5 % (ref 34.0–46.6)
Hemoglobin: 11.6 g/dL (ref 11.1–15.9)
Hepatitis B Surface Ag: NEGATIVE
Immature Grans (Abs): 0 10*3/uL (ref 0.0–0.1)
Immature Granulocytes: 0 %
Ketones, UA: NEGATIVE
Leukocytes,UA: NEGATIVE
Lymphocytes Absolute: 1.8 10*3/uL (ref 0.7–3.1)
Lymphs: 27 %
MCH: 27.3 pg (ref 26.6–33.0)
MCHC: 32.7 g/dL (ref 31.5–35.7)
MCV: 84 fL (ref 79–97)
Monocytes Absolute: 0.5 10*3/uL (ref 0.1–0.9)
Monocytes: 8 %
Neisseria Gonorrhoeae by PCR: NEGATIVE
Neutrophils Absolute: 4 10*3/uL (ref 1.4–7.0)
Neutrophils: 62 %
Nitrite, UA: NEGATIVE
Platelets: 309 10*3/uL (ref 150–450)
RBC, UA: NEGATIVE
RBC: 4.25 x10E6/uL (ref 3.77–5.28)
RDW: 14.1 % (ref 11.7–15.4)
RPR Ser Ql: NONREACTIVE
Rh Factor: POSITIVE
Rubella Antibodies, IGG: 1.36 index (ref >0.99)
Specific Gravity, UA: >=1.03 — AB (ref 1.005–1.030)
Urobilinogen, Ur: 0.2 mg/dL (ref 0.2–1.0)
WBC: 6.5 10*3/uL (ref 3.4–10.8)
pH, UA: 5.5 (ref 5.0–7.5)

## 2022-09-20 LAB — PROTEIN / CREATININE RATIO, URINE
Creatinine, Urine: 219.6 mg/dL
Protein, Ur: 15.8 mg/dL
Protein/Creat Ratio: 72 mg/g creat (ref 0–200)

## 2022-09-20 LAB — MICROSCOPIC EXAMINATION
Bacteria, UA: NONE SEEN
Casts: NONE SEEN /lpf

## 2022-09-20 LAB — URINE CULTURE, OB REFLEX

## 2022-09-20 LAB — COMPREHENSIVE METABOLIC PANEL
ALT: 36 IU/L — ABNORMAL HIGH (ref 0–32)
AST: 27 IU/L (ref 0–40)
Albumin/Globulin Ratio: 1.3 (ref 1.2–2.2)
Albumin: 4 g/dL (ref 3.9–4.9)
Alkaline Phosphatase: 88 IU/L (ref 44–121)
BUN/Creatinine Ratio: 17 (ref 9–23)
BUN: 10 mg/dL (ref 6–20)
Bilirubin Total: <0.2 mg/dL (ref 0.0–1.2)
CO2: 19 mmol/L — ABNORMAL LOW (ref 20–29)
Calcium: 9.1 mg/dL (ref 8.7–10.2)
Chloride: 99 mmol/L (ref 96–106)
Creatinine, Ser: 0.6 mg/dL (ref 0.57–1.00)
Globulin, Total: 3 g/dL (ref 1.5–4.5)
Glucose: 82 mg/dL (ref 70–99)
Potassium: 4.2 mmol/L (ref 3.5–5.2)
Sodium: 136 mmol/L (ref 134–144)
Total Protein: 7 g/dL (ref 6.0–8.5)
eGFR: 121 mL/min/{1.73_m2} (ref >59)

## 2022-09-20 LAB — HCV INTERPRETATION

## 2022-10-06 LAB — PANORAMA PRENATAL TEST FULL PANEL:PANORAMA TEST PLUS 5 ADDITIONAL MICRODELETIONS: FETAL FRACTION: 10.7

## 2022-10-24 ENCOUNTER — Encounter: Payer: Self-pay | Admitting: Obstetrics and Gynecology

## 2022-10-24 ENCOUNTER — Ambulatory Visit (INDEPENDENT_AMBULATORY_CARE_PROVIDER_SITE_OTHER): Payer: BC Managed Care – PPO | Admitting: Obstetrics and Gynecology

## 2022-10-24 VITALS — BP 122/86 | HR 88 | Wt 228.0 lb

## 2022-10-24 DIAGNOSIS — Z3492 Encounter for supervision of normal pregnancy, unspecified, second trimester: Secondary | ICD-10-CM

## 2022-10-24 DIAGNOSIS — Z3A15 15 weeks gestation of pregnancy: Secondary | ICD-10-CM

## 2022-10-24 DIAGNOSIS — Z3480 Encounter for supervision of other normal pregnancy, unspecified trimester: Secondary | ICD-10-CM

## 2022-10-24 DIAGNOSIS — Z6841 Body Mass Index (BMI) 40.0 and over, adult: Secondary | ICD-10-CM

## 2022-10-24 DIAGNOSIS — R030 Elevated blood-pressure reading, without diagnosis of hypertension: Secondary | ICD-10-CM

## 2022-10-24 MED ORDER — ASPIRIN 81 MG PO TBEC: 81.0000 mg | DELAYED_RELEASE_TABLET | Freq: Every day | ORAL | 12 refills | Status: DC

## 2022-10-24 NOTE — Progress Notes (Signed)
   PRENATAL VISIT NOTE  Subjective:  Julia Acosta is a 37 y.o. G3P2002 at [redacted]w[redacted]d being seen today for ongoing prenatal care.  She is currently monitored for the following issues for this low-risk pregnancy and has Polycystic ovaries and Supervision of other normal pregnancy, antepartum on their problem list.  Patient reports no complaints.  Contractions: Not present. Vag. Bleeding: None.  Movement: Absent. Denies leaking of fluid.   The following portions of the patient's history were reviewed and updated as appropriate: allergies, current medications, past family history, past medical history, past social history, past surgical history and problem list.   Objective:   Vitals:   10/24/22 0916  BP: 122/86  Pulse: 88  Weight: 228 lb (103.4 kg)    Fetal Status: Fetal Heart Rate (bpm): 156   Movement: Absent     General:  Alert, oriented and cooperative. Patient is in no acute distress.  Skin: Skin is warm and dry. No rash noted.   Cardiovascular: Normal heart rate noted  Respiratory: Normal respiratory effort, no problems with respiration noted  Abdomen: Soft, gravid, appropriate for gestational age.  Pain/Pressure: Absent     Pelvic: Cervical exam deferred        Extremities: Normal range of motion.  Edema: None  Mental Status: Normal mood and affect. Normal behavior. Normal judgment and thought content.   Assessment and Plan:  Pregnancy: G3P2002 at [redacted]w[redacted]d 1. Encounter for supervision of low-risk pregnancy in second trimester BP and FHR normal Not feeling movement yet  2. [redacted] weeks gestation of pregnancy Anticipatory guidance regarding next appts Doing well, no concerns  3. BMI 40.0-44.9, adult (HCC) TWG -2 lb (-0.907 kg)   4. Elevated BP without diagnosis of hypertension Elevated last visit, normotensive today Aspirin sent to pharmacy, will start taking    Preterm labor symptoms and general obstetric precautions including but not limited to vaginal bleeding,  contractions, leaking of fluid and fetal movement were reviewed in detail with the patient. Please refer to After Visit Summary for other counseling recommendations.   Future Appointments  Date Time Provider Department Center  11/21/2022  9:30 AM [facility] NURSE [facility]  11/21/2022  9:45 AM [facility] US4 [facility]  11/26/2022 11:10 AM Leggett, Fredrich Romans, MD [facility]      Albertine Grates, FNP

## 2022-11-16 DIAGNOSIS — O09529 Supervision of elderly multigravida, unspecified trimester: Secondary | ICD-10-CM | POA: Insufficient documentation

## 2022-11-16 DIAGNOSIS — O99210 Obesity complicating pregnancy, unspecified trimester: Secondary | ICD-10-CM | POA: Insufficient documentation

## 2022-11-21 ENCOUNTER — Ambulatory Visit: Payer: BC Managed Care – PPO

## 2022-11-21 ENCOUNTER — Encounter: Payer: Self-pay | Admitting: *Deleted

## 2022-11-21 ENCOUNTER — Other Ambulatory Visit: Payer: Self-pay | Admitting: *Deleted

## 2022-11-21 VITALS — BP 123/83 | HR 99

## 2022-11-21 DIAGNOSIS — O09522 Supervision of elderly multigravida, second trimester: Secondary | ICD-10-CM

## 2022-11-21 DIAGNOSIS — Z3480 Encounter for supervision of other normal pregnancy, unspecified trimester: Secondary | ICD-10-CM | POA: Diagnosis not present

## 2022-11-21 DIAGNOSIS — O09899 Supervision of other high risk pregnancies, unspecified trimester: Secondary | ICD-10-CM

## 2022-11-21 DIAGNOSIS — O99212 Obesity complicating pregnancy, second trimester: Secondary | ICD-10-CM

## 2022-11-21 DIAGNOSIS — Z3A19 19 weeks gestation of pregnancy: Secondary | ICD-10-CM | POA: Insufficient documentation

## 2022-11-21 DIAGNOSIS — Z363 Encounter for antenatal screening for malformations: Secondary | ICD-10-CM | POA: Diagnosis not present

## 2022-11-21 DIAGNOSIS — O09512 Supervision of elderly primigravida, second trimester: Secondary | ICD-10-CM | POA: Insufficient documentation

## 2022-11-22 DIAGNOSIS — O09899 Supervision of other high risk pregnancies, unspecified trimester: Secondary | ICD-10-CM | POA: Insufficient documentation

## 2022-11-26 ENCOUNTER — Ambulatory Visit (INDEPENDENT_AMBULATORY_CARE_PROVIDER_SITE_OTHER): Payer: BC Managed Care – PPO | Admitting: Obstetrics & Gynecology

## 2022-11-26 VITALS — BP 125/86 | HR 111 | Wt 236.0 lb

## 2022-11-26 DIAGNOSIS — Z3480 Encounter for supervision of other normal pregnancy, unspecified trimester: Secondary | ICD-10-CM

## 2022-11-26 DIAGNOSIS — O09899 Supervision of other high risk pregnancies, unspecified trimester: Secondary | ICD-10-CM

## 2022-11-26 NOTE — Progress Notes (Signed)
   PRENATAL VISIT NOTE  Subjective:  Julia Acosta is a 37 y.o. G3P2002 at [redacted]w[redacted]d being seen today for ongoing prenatal care.  She is currently monitored for the following issues for this high-risk pregnancy and has Polycystic ovaries; Supervision of other normal pregnancy, antepartum; Obesity affecting pregnancy; AMA (advanced maternal age) multigravida 35+; and Single umbilical artery affecting management of mother in singleton pregnancy, antepartum on their problem list.  Patient reports no complaints.  Contractions: Not present. Vag. Bleeding: None.  Movement: Present. Denies leaking of fluid.   The following portions of the patient's history were reviewed and updated as appropriate: allergies, current medications, past family history, past medical history, past social history, past surgical history and problem list.   Objective:   Vitals:   11/26/22 1104  BP: 125/86  Pulse: (!) 111  Weight: 236 lb (107 kg)    Fetal Status: Fetal Heart Rate (bpm): 156   Movement: Present     General:  Alert, oriented and cooperative. Patient is in no acute distress.  Skin: Skin is warm and dry. No rash noted.   Cardiovascular: Normal heart rate noted  Respiratory: Normal respiratory effort, no problems with respiration noted  Abdomen: Soft, gravid, appropriate for gestational age.  Pain/Pressure: Absent     Pelvic: Cervical exam deferred        Extremities: Normal range of motion.  Edema: None  Mental Status: Normal mood and affect. Normal behavior. Normal judgment and thought content.   Assessment and Plan:  Pregnancy: G3P2002 at 105w0d 1. Single umbilical artery affecting management of mother in singleton pregnancy, antepartum Serial growth [place]  2. Supervision of other normal pregnancy, antepartum Watch BP; taking baby ASA Taking car trip--advised walking q 2 hours and compression stockings.   Preterm labor symptoms and general obstetric precautions including but not limited to vaginal  bleeding, contractions, leaking of fluid and fetal movement were reviewed in detail with the patient. Please refer to After Visit Summary for other counseling recommendations.   No follow-ups on file.  Future Appointments  Date Time Provider Department Center  11/26/2022 11:10 AM Lesly Dukes, MD [facility]  12/28/2022 10:45 AM [facility] NURSE [facility]  12/28/2022 11:00 AM [facility] US1 [facility]  01/22/2023 10:15 AM [facility] NURSE [facility]  01/22/2023 10:30 AM [facility] US3 [facility]    Elsie Lincoln, MD

## 2022-11-28 LAB — AFP, SERUM, OPEN SPINA BIFIDA
AFP MoM: 1.56
AFP Value: 69 ng/mL
Gest. Age on Collection Date: 20 weeks
Maternal Age At EDD: 35 yr
OSBR Risk 1 IN: 2331
Test Results:: NEGATIVE
Weight: 236 [lb_av]

## 2022-12-21 NOTE — Progress Notes (Unsigned)
   PRENATAL VISIT NOTE  Subjective:  Julia Acosta is a 37 y.o. G3P2002 at [redacted]w[redacted]d being seen today for ongoing prenatal care.  She is currently monitored for the following issues for this low-risk pregnancy and has Polycystic ovaries; Supervision of other normal pregnancy, antepartum; Obesity affecting pregnancy; AMA (advanced maternal age) multigravida 35+; and Single umbilical artery affecting management of mother in singleton pregnancy, antepartum on their problem list.  Patient reports no complaints.  Contractions: Not present. Vag. Bleeding: None.  Movement: Present. Denies leaking of fluid.   The following portions of the patient's history were reviewed and updated as appropriate: allergies, current medications, past family history, past medical history, past social history, past surgical history and problem list.   Objective:   Vitals:   12/24/22 1546  BP: 130/86  Pulse: 83  Weight: 238 lb (108 kg)    Fetal Status: Fetal Heart Rate (bpm): 144 Fundal Height: 25 cm Movement: Present     General:  Alert, oriented and cooperative. Patient is in no acute distress.  Skin: Skin is warm and dry. No rash noted.   Cardiovascular: Normal heart rate noted  Respiratory: Normal respiratory effort, no problems with respiration noted  Abdomen: Soft, gravid, appropriate for gestational age.  Pain/Pressure: Absent     Pelvic: Cervical exam deferred        Extremities: Normal range of motion.  Edema: None  Mental Status: Normal mood and affect. Normal behavior. Normal judgment and thought content.   Assessment and Plan:  Pregnancy: G3P2002 at [redacted]w[redacted]d 1. Single umbilical artery affecting management of mother in singleton pregnancy, antepartum Serial growth plan starting on 8/16.   2. Multigravida of advanced maternal age in second trimester LR NIP  3. Obesity affecting pregnancy in second trimester, unspecified obesity type  4. Supervision of other normal pregnancy, antepartum 28w labs next  time AFP wnl   Preterm labor symptoms and general obstetric precautions including but not limited to vaginal bleeding, contractions, leaking of fluid and fetal movement were reviewed in detail with the patient. Please refer to After Visit Summary for other counseling recommendations.   Return in about 4 weeks (around 01/21/2023) for 2 hr GTT/28w labs.  Future Appointments  Date Time Provider Department Center  12/28/2022 10:45 AM [facility] NURSE [facility]  12/28/2022 11:00 AM [facility] US1 [facility]  01/22/2023 10:15 AM [facility] NURSE [facility]  01/22/2023 10:30 AM [facility] US3 [facility]  01/25/2023  8:30 AM Sue Lush, FNP [facility]    Milas Hock, MD

## 2022-12-24 ENCOUNTER — Ambulatory Visit (INDEPENDENT_AMBULATORY_CARE_PROVIDER_SITE_OTHER): Payer: BC Managed Care – PPO | Admitting: Obstetrics and Gynecology

## 2022-12-24 ENCOUNTER — Encounter: Payer: Self-pay | Admitting: Obstetrics and Gynecology

## 2022-12-24 VITALS — BP 130/86 | HR 83 | Wt 238.0 lb

## 2022-12-24 DIAGNOSIS — O99212 Obesity complicating pregnancy, second trimester: Secondary | ICD-10-CM

## 2022-12-24 DIAGNOSIS — Z3480 Encounter for supervision of other normal pregnancy, unspecified trimester: Secondary | ICD-10-CM

## 2022-12-24 DIAGNOSIS — O09899 Supervision of other high risk pregnancies, unspecified trimester: Secondary | ICD-10-CM

## 2022-12-24 DIAGNOSIS — O09522 Supervision of elderly multigravida, second trimester: Secondary | ICD-10-CM

## 2022-12-24 DIAGNOSIS — Z3A24 24 weeks gestation of pregnancy: Secondary | ICD-10-CM

## 2022-12-28 ENCOUNTER — Ambulatory Visit: Payer: BC Managed Care – PPO

## 2022-12-28 ENCOUNTER — Other Ambulatory Visit: Payer: Self-pay | Admitting: *Deleted

## 2022-12-28 ENCOUNTER — Ambulatory Visit: Payer: BC Managed Care – PPO | Admitting: *Deleted

## 2022-12-28 VITALS — BP 124/81 | HR 89

## 2022-12-28 DIAGNOSIS — O43192 Other malformation of placenta, second trimester: Secondary | ICD-10-CM | POA: Diagnosis not present

## 2022-12-28 DIAGNOSIS — E669 Obesity, unspecified: Secondary | ICD-10-CM | POA: Diagnosis not present

## 2022-12-28 DIAGNOSIS — O09522 Supervision of elderly multigravida, second trimester: Secondary | ICD-10-CM | POA: Insufficient documentation

## 2022-12-28 DIAGNOSIS — Z3A24 24 weeks gestation of pregnancy: Secondary | ICD-10-CM

## 2022-12-28 DIAGNOSIS — O99212 Obesity complicating pregnancy, second trimester: Secondary | ICD-10-CM | POA: Insufficient documentation

## 2022-12-28 DIAGNOSIS — O09899 Supervision of other high risk pregnancies, unspecified trimester: Secondary | ICD-10-CM

## 2023-01-18 ENCOUNTER — Encounter: Payer: Self-pay | Admitting: *Deleted

## 2023-01-22 ENCOUNTER — Ambulatory Visit: Payer: BC Managed Care – PPO

## 2023-01-22 ENCOUNTER — Ambulatory Visit: Payer: BC Managed Care – PPO | Attending: Maternal & Fetal Medicine

## 2023-01-22 ENCOUNTER — Other Ambulatory Visit: Payer: Self-pay | Admitting: *Deleted

## 2023-01-22 DIAGNOSIS — O09899 Supervision of other high risk pregnancies, unspecified trimester: Secondary | ICD-10-CM | POA: Insufficient documentation

## 2023-01-22 DIAGNOSIS — O358XX0 Maternal care for other (suspected) fetal abnormality and damage, not applicable or unspecified: Secondary | ICD-10-CM

## 2023-01-22 DIAGNOSIS — E669 Obesity, unspecified: Secondary | ICD-10-CM

## 2023-01-22 DIAGNOSIS — O99212 Obesity complicating pregnancy, second trimester: Secondary | ICD-10-CM | POA: Insufficient documentation

## 2023-01-22 DIAGNOSIS — Z148 Genetic carrier of other disease: Secondary | ICD-10-CM

## 2023-01-22 DIAGNOSIS — O09523 Supervision of elderly multigravida, third trimester: Secondary | ICD-10-CM

## 2023-01-22 DIAGNOSIS — Z3A28 28 weeks gestation of pregnancy: Secondary | ICD-10-CM

## 2023-01-22 DIAGNOSIS — O99213 Obesity complicating pregnancy, third trimester: Secondary | ICD-10-CM | POA: Diagnosis not present

## 2023-01-25 ENCOUNTER — Ambulatory Visit (INDEPENDENT_AMBULATORY_CARE_PROVIDER_SITE_OTHER): Payer: BC Managed Care – PPO | Admitting: Obstetrics and Gynecology

## 2023-01-25 VITALS — BP 126/88 | HR 82 | Wt 238.0 lb

## 2023-01-25 DIAGNOSIS — Z3A28 28 weeks gestation of pregnancy: Secondary | ICD-10-CM

## 2023-01-25 DIAGNOSIS — Z23 Encounter for immunization: Secondary | ICD-10-CM

## 2023-01-25 DIAGNOSIS — Z3480 Encounter for supervision of other normal pregnancy, unspecified trimester: Secondary | ICD-10-CM

## 2023-01-25 DIAGNOSIS — Z3483 Encounter for supervision of other normal pregnancy, third trimester: Secondary | ICD-10-CM | POA: Diagnosis not present

## 2023-01-25 MED ORDER — FAMOTIDINE 20 MG PO TABS: 20.0000 mg | ORAL_TABLET | Freq: Two times a day (BID) | ORAL | 1 refills | Status: AC

## 2023-01-25 NOTE — Progress Notes (Signed)
   PRENATAL VISIT NOTE  Subjective:  Julia Acosta is a 37 y.o. G3P2002 at [redacted]w[redacted]d being seen today for ongoing prenatal care.  She is currently monitored for the following issues for this low-risk pregnancy and has Supervision of other normal pregnancy, antepartum; Obesity affecting pregnancy; AMA (advanced maternal age) multigravida 35+; and Single umbilical artery affecting management of mother in singleton pregnancy, antepartum on their problem list.  Patient reports no complaints.  Contractions: Not present. Vag. Bleeding: None.  Movement: Present. Denies leaking of fluid.   The following portions of the patient's history were reviewed and updated as appropriate: allergies, current medications, past family history, past medical history, past social history, past surgical history and problem list.   Objective:   Vitals:   01/25/23 0825  BP: 126/88  Pulse: 82  Weight: 238 lb (108 kg)    Fetal Status: Fetal Heart Rate (bpm): 145 Fundal Height: 31 cm Movement: Present     General:  Alert, oriented and cooperative. Patient is in no acute distress.  Skin: Skin is warm and dry. No rash noted.   Cardiovascular: Normal heart rate noted  Respiratory: Normal respiratory effort, no problems with respiration noted  Abdomen: Soft, gravid, appropriate for gestational age.  Pain/Pressure: Absent     Pelvic: Cervical exam deferred        Extremities: Normal range of motion.  Edema: None  Mental Status: Normal mood and affect. Normal behavior. Normal judgment and thought content.   Assessment and Plan:  Pregnancy: G3P2002 at [redacted]w[redacted]d 1. Supervision of other normal pregnancy, antepartum  - Glucose Tolerance, 2 Hours w/1 Hour - CBC - HIV antibody (with reflex) - RPR - worsening GERD, Tums no longer working -Rx: Pepcid -Watch BP, borderline through pregnancy.  Continue BASA   Preterm labor symptoms and general obstetric precautions including but not limited to vaginal bleeding, contractions,  leaking of fluid and fetal movement were reviewed in detail with the patient. Please refer to After Visit Summary for other counseling recommendations.   No follow-ups on file.  Future Appointments  Date Time Provider Department Center  02/08/2023 10:50 AM Jazzmyn Filion, Harolyn Rutherford, NP [facility]  02/19/2023 10:50 AM Nevaan Bunton, Harolyn Rutherford, NP [facility]  02/22/2023 10:30 AM [facility] US3 [facility]  03/05/2023 10:30 AM [facility] US4 [facility]  03/08/2023 10:10 AM Devyn Griffing, Harolyn Rutherford, NP [facility]  03/12/2023 10:30 AM [facility] US4 [facility]  03/19/2023 10:30 AM [facility] US1 [facility]    Venia Carbon, NP

## 2023-01-25 NOTE — Progress Notes (Deleted)
   PRENATAL VISIT NOTE  Subjective:  Julia Acosta is a 37 y.o. G3P2002 at [redacted]w[redacted]d being seen today for ongoing prenatal care.  She is currently monitored for the following issues for this {Blank single:19197::"high-risk","low-risk"} pregnancy and has Supervision of other normal pregnancy, antepartum; Obesity affecting pregnancy; AMA (advanced maternal age) multigravida 35+; and Single umbilical artery affecting management of mother in singleton pregnancy, antepartum on their problem list.  Patient reports {sx:[id number]}.  Contractions: Not present. Vag. Bleeding: None.  Movement: Present. Denies leaking of fluid.   The following portions of the patient's history were reviewed and updated as appropriate: allergies, current medications, past family history, past medical history, past social history, past surgical history and problem list.   Objective:   Vitals:   01/25/23 0825  BP: 126/88  Pulse: 82  Weight: 238 lb (108 kg)    Fetal Status: Fetal Heart Rate (bpm): 145   Movement: Present     General:  Alert, oriented and cooperative. Patient is in no acute distress.  Skin: Skin is warm and dry. No rash noted.   Cardiovascular: Normal heart rate noted  Respiratory: Normal respiratory effort, no problems with respiration noted  Abdomen: Soft, gravid, appropriate for gestational age.  Pain/Pressure: Absent     Pelvic: {Blank single:19197::"Cervical exam performed in the presence of a chaperone","Cervical exam deferred"}        Extremities: Normal range of motion.  Edema: None  Mental Status: Normal mood and affect. Normal behavior. Normal judgment and thought content.   Assessment and Plan:  Pregnancy: G3P2002 at [redacted]w[redacted]d 1. Supervision of other normal pregnancy, antepartum  - Experiencing GERD. Tums no longer working. Rx: Pepcid  - Glucose Tolerance, 2 Hours w/1 Hour - CBC - HIV antibody (with reflex) - RPR - Continue MFM [place].  - 2 hour GTT today.   Preterm labor symptoms and general  obstetric precautions including but not limited to vaginal bleeding, contractions, leaking of fluid and fetal movement were reviewed in detail with the patient. Please refer to After Visit Summary for other counseling recommendations.   No follow-ups on file.  Future Appointments  Date Time Provider Department Center  02/08/2023 10:50 AM Zakar Brosch, Harolyn Rutherford, NP [facility]  02/19/2023 10:50 AM Merle Cirelli, Harolyn Rutherford, NP [facility]  02/22/2023 10:30 AM [facility] US3 [facility]  03/05/2023 10:30 AM [facility] US4 [facility]  03/08/2023 10:10 AM Jaylie Neaves, Harolyn Rutherford, NP [facility]  03/12/2023 10:30 AM [facility]  03/19/2023 10:30 AM [facility] US1 [facility]    Venia Carbon, NP

## 2023-01-26 LAB — CBC
Hematocrit: 33.4 % — ABNORMAL LOW (ref 34.0–46.6)
Hemoglobin: 10.7 g/dL — ABNORMAL LOW (ref 11.1–15.9)
MCH: 27.6 pg (ref 26.6–33.0)
MCHC: 32 g/dL (ref 31.5–35.7)
MCV: 86 fL (ref 79–97)
Platelets: 273 10*3/uL (ref 150–450)
RBC: 3.88 x10E6/uL (ref 3.77–5.28)
RDW: 13.9 % (ref 11.7–15.4)
WBC: 7.4 10*3/uL (ref 3.4–10.8)

## 2023-01-26 LAB — GLUCOSE TOLERANCE, 2 HOURS W/ 1HR
Glucose, 1 hour: 122 mg/dL (ref 70–179)
Glucose, 2 hour: 93 mg/dL (ref 70–152)
Glucose, Fasting: 72 mg/dL (ref 70–91)

## 2023-01-26 LAB — HIV ANTIBODY (ROUTINE TESTING W REFLEX): HIV Screen 4th Generation wRfx: NONREACTIVE

## 2023-01-26 LAB — RPR: RPR Ser Ql: NONREACTIVE

## 2023-02-08 ENCOUNTER — Telehealth (INDEPENDENT_AMBULATORY_CARE_PROVIDER_SITE_OTHER): Payer: BC Managed Care – PPO | Admitting: Obstetrics and Gynecology

## 2023-02-08 DIAGNOSIS — Z3480 Encounter for supervision of other normal pregnancy, unspecified trimester: Secondary | ICD-10-CM

## 2023-02-08 DIAGNOSIS — Z3A30 30 weeks gestation of pregnancy: Secondary | ICD-10-CM

## 2023-02-08 DIAGNOSIS — O409XX0 Polyhydramnios, unspecified trimester, not applicable or unspecified: Secondary | ICD-10-CM | POA: Insufficient documentation

## 2023-02-08 DIAGNOSIS — O09522 Supervision of elderly multigravida, second trimester: Secondary | ICD-10-CM

## 2023-02-08 DIAGNOSIS — O09523 Supervision of elderly multigravida, third trimester: Secondary | ICD-10-CM

## 2023-02-08 NOTE — Progress Notes (Signed)
    TELEHEALTH OBSTETRICS VISIT ENCOUNTER NOTE  Provider location: Center for [facility] [place]   Patient location: Home  I connected with Julia Acosta on 02/08/23 at 10:50 AM EDT by telephone at home and verified that I am speaking with the correct person using two identifiers. Of note, unable to do video encounter due to technical difficulties.    I discussed the limitations, risks, security and privacy concerns of performing an evaluation and management service by telephone and the availability of in person appointments. I also discussed with the patient that there may be a patient responsible charge related to this service. The patient expressed understanding and agreed to proceed.  Subjective:  Julia Acosta is a 37 y.o. G3P2002 at [redacted]w[redacted]d being followed for ongoing prenatal care.  She is currently monitored for the following issues for this low-risk pregnancy and has Supervision of other normal pregnancy, antepartum; Obesity affecting pregnancy; AMA (advanced maternal age) multigravida 35+; and Single umbilical artery affecting management of mother in singleton pregnancy, antepartum on their problem list.  Patient reports  Insomnia . Reports fetal movement. Denies any contractions, bleeding or leaking of fluid.   The following portions of the patient's history were reviewed and updated as appropriate: allergies, current medications, past family history, past medical history, past social history, past surgical history and problem list.   Objective:  Last menstrual period 07/09/2022, unknown if currently breastfeeding. General:  Alert, oriented and cooperative.   Mental Status: Normal mood and affect perceived. Normal judgment and thought content.  Rest of physical exam deferred due to type of encounter  Assessment and Plan:  Pregnancy: G3P2002 at [redacted]w[redacted]d  1. Supervision of other normal pregnancy, antepartum  Good fetal movement.  No BP cuff at home. Insomnia at  night. Recommend magnesium 400 mg at bedtime. Unisom Is ok to use.  Flu and RSV vaccine with next visit.  Taking BASA   2. Multigravida of advanced maternal age in second trimester  EFW 1285 grams @ 63% Breech position Reports mild poly   Preterm labor symptoms and general obstetric precautions including but not limited to vaginal bleeding, contractions, leaking of fluid and fetal movement were reviewed in detail with the patient.  I discussed the assessment and treatment plan with the patient. The patient was provided an opportunity to ask questions and all were answered. The patient agreed with the plan and demonstrated an understanding of the instructions. The patient was advised to call back or seek an in-person office evaluation/go to [facility] at [facility] for any urgent or concerning symptoms. Please refer to After Visit Summary for other counseling recommendations.   I provided 10 minutes of non-face-to-face time during this encounter.  No follow-ups on file.  Future Appointments  Date Time Provider Department Center  02/19/2023 10:50 AM Alexi Dorminey, Harolyn Rutherford, NP [facility]  02/22/2023 10:30 AM [facility] US3 [facility]  03/05/2023 10:30 AM [facility] US4 [facility]  03/08/2023 10:10 AM Finnian Husted, Harolyn Rutherford, NP [facility]  03/12/2023 10:30 AM [facility] US4 [facility]  03/19/2023 10:30 AM [facility] US1 [facility]    Venia Carbon, NP Center for [employer]

## 2023-02-19 ENCOUNTER — Ambulatory Visit (INDEPENDENT_AMBULATORY_CARE_PROVIDER_SITE_OTHER): Payer: BC Managed Care – PPO | Admitting: Obstetrics and Gynecology

## 2023-02-19 VITALS — BP 131/85 | HR 101 | Wt 243.0 lb

## 2023-02-19 DIAGNOSIS — O99212 Obesity complicating pregnancy, second trimester: Secondary | ICD-10-CM

## 2023-02-19 DIAGNOSIS — Z3480 Encounter for supervision of other normal pregnancy, unspecified trimester: Secondary | ICD-10-CM

## 2023-02-19 MED ORDER — HYDROXYZINE PAMOATE 25 MG PO CAPS
25.0000 mg | ORAL_CAPSULE | Freq: Every evening | ORAL | 1 refills | Status: DC | PRN
Start: 2023-02-19 — End: 2023-04-15

## 2023-02-19 NOTE — Progress Notes (Signed)
   PRENATAL VISIT NOTE  Subjective:  Julia Acosta is a 37 y.o. G3P2002 at [redacted]w[redacted]d being seen today for ongoing prenatal care.  She is currently monitored for the following issues for this low-risk pregnancy and has Supervision of other normal pregnancy, antepartum; Obesity affecting pregnancy; AMA (advanced maternal age) multigravida 35+; Single umbilical artery affecting management of mother in singleton pregnancy, antepartum; and Polyhydramnios affecting pregnancy on their problem list.  Patient reports  Insomnia; magnesium not working well .  Contractions: Not present. Vag. Bleeding: None.  Movement: Present. Denies leaking of fluid.   The following portions of the patient's history were reviewed and updated as appropriate: allergies, current medications, past family history, past medical history, past social history, past surgical history and problem list.   Objective:   Vitals:   02/19/23 1042  BP: 131/85  Pulse: (!) 101  Weight: 243 lb (110.2 kg)    Fetal Status: Fetal Heart Rate (bpm): 141 Fundal Height: 33 cm Movement: Present     General:  Alert, oriented and cooperative. Patient is in no acute distress.  Skin: Skin is warm and dry. No rash noted.   Cardiovascular: Normal heart rate noted  Respiratory: Normal respiratory effort, no problems with respiration noted  Abdomen: Soft, gravid, appropriate for gestational age.  Pain/Pressure: Absent     Pelvic: Cervical exam deferred        Extremities: Normal range of motion.  Edema: None  Mental Status: Normal mood and affect. Normal behavior. Normal judgment and thought content.   Assessment and Plan:  Pregnancy: G3P2002 at [redacted]w[redacted]d  1. Supervision of other normal pregnancy, antepartum  Insomnia; RX vistaril. Continue magnesium 2 hour GTT normal   2. Obesity affecting pregnancy in second trimester, unspecified obesity type  Continue BASA BP normal.   Preterm labor symptoms and general obstetric precautions including but  not limited to vaginal bleeding, contractions, leaking of fluid and fetal movement were reviewed in detail with the patient. Please refer to After Visit Summary for other counseling recommendations.   No follow-ups on file.  Future Appointments  Date Time Provider Department Center  02/22/2023 10:30 AM [facility] US3 [facility]  03/05/2023 10:30 AM [facility] US4 [facility]  03/08/2023 10:10 AM Ornella Coderre, Harolyn Rutherford, NP [facility]  03/12/2023 10:30 AM [facility] US4 [facility]  03/19/2023 10:30 AM [facility] US1 [facility]  03/20/2023  9:50 AM Milas Hock, MD [facility]  03/27/2023  9:50 AM Milas Hock, MD [facility]  04/03/2023  9:50 AM Milas Hock, MD [facility]  04/10/2023  9:50 AM Lennart Pall, MD [facility]    Venia Carbon, NP

## 2023-02-22 ENCOUNTER — Ambulatory Visit: Payer: BC Managed Care – PPO | Attending: Maternal & Fetal Medicine

## 2023-02-22 DIAGNOSIS — E669 Obesity, unspecified: Secondary | ICD-10-CM | POA: Diagnosis not present

## 2023-02-22 DIAGNOSIS — O99212 Obesity complicating pregnancy, second trimester: Secondary | ICD-10-CM | POA: Insufficient documentation

## 2023-02-22 DIAGNOSIS — O3663X0 Maternal care for excessive fetal growth, third trimester, not applicable or unspecified: Secondary | ICD-10-CM

## 2023-02-22 DIAGNOSIS — O43193 Other malformation of placenta, third trimester: Secondary | ICD-10-CM | POA: Diagnosis not present

## 2023-02-22 DIAGNOSIS — O99213 Obesity complicating pregnancy, third trimester: Secondary | ICD-10-CM

## 2023-02-22 DIAGNOSIS — Z3A32 32 weeks gestation of pregnancy: Secondary | ICD-10-CM

## 2023-02-22 DIAGNOSIS — O09899 Supervision of other high risk pregnancies, unspecified trimester: Secondary | ICD-10-CM | POA: Diagnosis not present

## 2023-03-05 ENCOUNTER — Other Ambulatory Visit: Payer: Self-pay

## 2023-03-05 ENCOUNTER — Ambulatory Visit: Payer: BC Managed Care – PPO | Attending: Obstetrics and Gynecology

## 2023-03-05 DIAGNOSIS — O3663X0 Maternal care for excessive fetal growth, third trimester, not applicable or unspecified: Secondary | ICD-10-CM | POA: Diagnosis not present

## 2023-03-05 DIAGNOSIS — O99213 Obesity complicating pregnancy, third trimester: Secondary | ICD-10-CM | POA: Insufficient documentation

## 2023-03-05 DIAGNOSIS — E669 Obesity, unspecified: Secondary | ICD-10-CM | POA: Diagnosis not present

## 2023-03-05 DIAGNOSIS — Z3A34 34 weeks gestation of pregnancy: Secondary | ICD-10-CM

## 2023-03-05 DIAGNOSIS — O43193 Other malformation of placenta, third trimester: Secondary | ICD-10-CM

## 2023-03-05 DIAGNOSIS — Z148 Genetic carrier of other disease: Secondary | ICD-10-CM | POA: Diagnosis not present

## 2023-03-08 ENCOUNTER — Ambulatory Visit (INDEPENDENT_AMBULATORY_CARE_PROVIDER_SITE_OTHER): Payer: BC Managed Care – PPO | Admitting: Obstetrics and Gynecology

## 2023-03-08 VITALS — BP 133/96 | HR 102 | Wt 243.0 lb

## 2023-03-08 DIAGNOSIS — Z3A34 34 weeks gestation of pregnancy: Secondary | ICD-10-CM

## 2023-03-08 DIAGNOSIS — Z3480 Encounter for supervision of other normal pregnancy, unspecified trimester: Secondary | ICD-10-CM | POA: Diagnosis not present

## 2023-03-08 DIAGNOSIS — Z23 Encounter for immunization: Secondary | ICD-10-CM | POA: Diagnosis not present

## 2023-03-08 DIAGNOSIS — R030 Elevated blood-pressure reading, without diagnosis of hypertension: Secondary | ICD-10-CM | POA: Diagnosis not present

## 2023-03-08 DIAGNOSIS — Z3483 Encounter for supervision of other normal pregnancy, third trimester: Secondary | ICD-10-CM

## 2023-03-08 NOTE — Progress Notes (Signed)
   PRENATAL VISIT NOTE  Subjective:  Julia Acosta is a 37 y.o. G3P2002 at [redacted]w[redacted]d being seen today for ongoing prenatal care.  She is currently monitored for the following issues for this high-risk pregnancy and has Supervision of other normal pregnancy, antepartum; Obesity affecting pregnancy; AMA (advanced maternal age) multigravida 35+; Single umbilical artery affecting management of mother in singleton pregnancy, antepartum; and Polyhydramnios affecting pregnancy on their problem list.  Patient reports  congestion, cold symptoms .  Contractions: Not present. Vag. Bleeding: None.  Movement: Present. Denies leaking of fluid.   The following portions of the patient's history were reviewed and updated as appropriate: allergies, current medications, past family history, past medical history, past social history, past surgical history and problem list.   Objective:   Vitals:   03/08/23 1006 03/08/23 1035  BP: (!) 149/101 (!) 133/96  Pulse: (!) 102 (!) 102  Weight: 243 lb (110.2 kg)     Fetal Status: Fetal Heart Rate (bpm): 137   Movement: Present     General:  Alert, oriented and cooperative. Patient is in no acute distress.  Skin: Skin is warm and dry. No rash noted.   Cardiovascular: Normal heart rate noted  Respiratory: Normal respiratory effort, no problems with respiration noted  Abdomen: Soft, gravid, appropriate for gestational age.  Pain/Pressure: Absent     Pelvic: Cervical exam deferred        Extremities: Normal range of motion.  Edema: None  Mental Status: Normal mood and affect. Normal behavior. Normal judgment and thought content.   Assessment and Plan:   1. Needs flu shot  - Flu vaccine trivalent PF, 6mos and older(Flulaval,Afluria,Fluarix,Fluzone)  2. Supervision of other normal pregnancy, antepartum  - CBC with Differential/Platelet - Comp Met (CMET) - Protein / creatinine ratio, urine  3. Elevated BP without diagnosis of hypertension  - BP borderline for a  few weeks, elevated today. - Asymptomatic. No vision changes or HA - No history of HTN - Taking BASA daily - CBC with Differential/Platelet - Comp Met (CMET) - Protein / creatinine ratio, urine  - Return Monday for BP check. Reviewed delivery at 37 weeks if GHTN is diagnosed - Strict preeclampsia precautions.  - Recommend RSV vaccine.   Preterm labor symptoms and general obstetric precautions including but not limited to vaginal bleeding, contractions, leaking of fluid and fetal movement were reviewed in detail with the patient. Please refer to After Visit Summary for other counseling recommendations.   Return Needs BP check on Monday 10/28.  Future Appointments  Date Time Provider Department Center  03/11/2023 10:30 AM [facility] NURSE [facility]  03/12/2023 10:30 AM [facility] US4 [facility]  03/19/2023 10:30 AM [facility] US1 [facility]  03/20/2023  9:50 AM Milas Hock, MD [facility]  03/27/2023  9:50 AM Milas Hock, MD [facility]  04/03/2023  9:50 AM Milas Hock, MD [facility]  04/10/2023  9:50 AM Lennart Pall, MD [facility]    Venia Carbon, NP

## 2023-03-09 LAB — COMPREHENSIVE METABOLIC PANEL
ALT: 12 [IU]/L (ref 0–32)
AST: 12 [IU]/L (ref 0–40)
Albumin: 3.7 g/dL — ABNORMAL LOW (ref 3.9–4.9)
Alkaline Phosphatase: 116 [IU]/L (ref 44–121)
BUN/Creatinine Ratio: 16 (ref 9–23)
BUN: 9 mg/dL (ref 6–20)
Bilirubin Total: <0.2 mg/dL (ref 0.0–1.2)
CO2: 17 mmol/L — ABNORMAL LOW (ref 20–29)
Calcium: 8.9 mg/dL (ref 8.7–10.2)
Chloride: 104 mmol/L (ref 96–106)
Creatinine, Ser: 0.56 mg/dL — ABNORMAL LOW (ref 0.57–1.00)
Globulin, Total: 2.6 g/dL (ref 1.5–4.5)
Glucose: 96 mg/dL (ref 70–99)
Potassium: 3.7 mmol/L (ref 3.5–5.2)
Sodium: 137 mmol/L (ref 134–144)
Total Protein: 6.3 g/dL (ref 6.0–8.5)
eGFR: 123 mL/min/{1.73_m2} (ref >59)

## 2023-03-09 LAB — CBC
Hematocrit: 33.5 % — ABNORMAL LOW (ref 34.0–46.6)
Hemoglobin: 10.8 g/dL — ABNORMAL LOW (ref 11.1–15.9)
MCH: 27.7 pg (ref 26.6–33.0)
MCHC: 32.2 g/dL (ref 31.5–35.7)
MCV: 86 fL (ref 79–97)
Platelets: 268 10*3/uL (ref 150–450)
RBC: 3.9 x10E6/uL (ref 3.77–5.28)
RDW: 13.8 % (ref 11.7–15.4)
WBC: 7.7 10*3/uL (ref 3.4–10.8)

## 2023-03-09 LAB — PROTEIN / CREATININE RATIO, URINE
Creatinine, Urine: 319.7 mg/dL
Protein, Ur: 44.3 mg/dL
Protein/Creat Ratio: 139 mg/g{creat} (ref 0–200)

## 2023-03-11 ENCOUNTER — Ambulatory Visit (INDEPENDENT_AMBULATORY_CARE_PROVIDER_SITE_OTHER): Payer: BC Managed Care – PPO | Admitting: *Deleted

## 2023-03-11 VITALS — BP 138/88 | HR 96

## 2023-03-11 DIAGNOSIS — R030 Elevated blood-pressure reading, without diagnosis of hypertension: Secondary | ICD-10-CM

## 2023-03-11 DIAGNOSIS — Z0130 Encounter for examination of blood pressure without abnormal findings: Secondary | ICD-10-CM

## 2023-03-11 NOTE — Progress Notes (Signed)
BP only today is 138/86.  She denies any headaches.  Will continue to monitor her BP and she will call if HA's or increase in swelling.

## 2023-03-12 ENCOUNTER — Other Ambulatory Visit: Payer: Self-pay

## 2023-03-12 ENCOUNTER — Other Ambulatory Visit: Payer: Self-pay | Admitting: *Deleted

## 2023-03-12 ENCOUNTER — Ambulatory Visit: Payer: BC Managed Care – PPO | Attending: Obstetrics and Gynecology

## 2023-03-12 DIAGNOSIS — Z148 Genetic carrier of other disease: Secondary | ICD-10-CM | POA: Insufficient documentation

## 2023-03-12 DIAGNOSIS — O358XX0 Maternal care for other (suspected) fetal abnormality and damage, not applicable or unspecified: Secondary | ICD-10-CM

## 2023-03-12 DIAGNOSIS — O3663X0 Maternal care for excessive fetal growth, third trimester, not applicable or unspecified: Secondary | ICD-10-CM | POA: Diagnosis not present

## 2023-03-12 DIAGNOSIS — O99213 Obesity complicating pregnancy, third trimester: Secondary | ICD-10-CM | POA: Diagnosis not present

## 2023-03-12 DIAGNOSIS — E669 Obesity, unspecified: Secondary | ICD-10-CM

## 2023-03-12 DIAGNOSIS — O09523 Supervision of elderly multigravida, third trimester: Secondary | ICD-10-CM

## 2023-03-12 DIAGNOSIS — O409XX0 Polyhydramnios, unspecified trimester, not applicable or unspecified: Secondary | ICD-10-CM

## 2023-03-12 DIAGNOSIS — Z3A35 35 weeks gestation of pregnancy: Secondary | ICD-10-CM

## 2023-03-19 ENCOUNTER — Other Ambulatory Visit: Payer: Self-pay

## 2023-03-19 ENCOUNTER — Ambulatory Visit: Payer: BC Managed Care – PPO | Attending: Obstetrics and Gynecology

## 2023-03-19 ENCOUNTER — Other Ambulatory Visit: Payer: Self-pay | Admitting: *Deleted

## 2023-03-19 DIAGNOSIS — O99213 Obesity complicating pregnancy, third trimester: Secondary | ICD-10-CM | POA: Diagnosis not present

## 2023-03-19 DIAGNOSIS — E669 Obesity, unspecified: Secondary | ICD-10-CM | POA: Diagnosis not present

## 2023-03-19 DIAGNOSIS — O358XX0 Maternal care for other (suspected) fetal abnormality and damage, not applicable or unspecified: Secondary | ICD-10-CM | POA: Diagnosis not present

## 2023-03-19 DIAGNOSIS — O09523 Supervision of elderly multigravida, third trimester: Secondary | ICD-10-CM

## 2023-03-19 DIAGNOSIS — O3663X0 Maternal care for excessive fetal growth, third trimester, not applicable or unspecified: Secondary | ICD-10-CM

## 2023-03-19 DIAGNOSIS — Z3A36 36 weeks gestation of pregnancy: Secondary | ICD-10-CM

## 2023-03-19 DIAGNOSIS — Z148 Genetic carrier of other disease: Secondary | ICD-10-CM | POA: Insufficient documentation

## 2023-03-19 NOTE — Progress Notes (Unsigned)
   PRENATAL VISIT NOTE  Subjective:  Julia Acosta is a 37 y.o. G3P2002 at [redacted]w[redacted]d being seen today for ongoing prenatal care.  She is currently monitored for the following issues for this low-risk pregnancy and has Supervision of other normal pregnancy, antepartum; Obesity affecting pregnancy; AMA (advanced maternal age) multigravida 35+; Single umbilical artery affecting management of mother in singleton pregnancy, antepartum; and Polyhydramnios affecting pregnancy on their problem list.  Patient reports {sx:[id number]}.   .  .   . Denies leaking of fluid.   The following portions of the patient's history were reviewed and updated as appropriate: allergies, current medications, past family history, past medical history, past social history, past surgical history and problem list.   Objective:  There were no vitals filed for this visit.  Fetal Status:           General:  Alert, oriented and cooperative. Patient is in no acute distress.  Skin: Skin is warm and dry. No rash noted.   Cardiovascular: Normal heart rate noted  Respiratory: Normal respiratory effort, no problems with respiration noted  Abdomen: Soft, gravid, appropriate for gestational age.        Pelvic: {Blank single:19197::"Cervical exam performed in the presence of a chaperone","Cervical exam deferred"}        Extremities: Normal range of motion.     Mental Status: Normal mood and affect. Normal behavior. Normal judgment and thought content.   Assessment and Plan:  Pregnancy: G3P2002 at [redacted]w[redacted]d 1. Single umbilical artery affecting management of mother in singleton pregnancy, antepartum Growth 92%ile, normal AFI, AC >99%ile. Due to LGA combined with SUA, AMA and elevated BMI, MFM recommends delivery at 39w. Reviewed with pt - ***  2. Multigravida of advanced maternal age in second trimester  3. Obesity affecting pregnancy in second trimester, unspecified obesity type  4. Polyhydramnios affecting pregnancy Resolved. AFI now  8.63.   5. Supervision of other normal pregnancy, antepartum GBS done today.  Scheduled for IOL for 39w (11/25). ***   Preterm labor symptoms and general obstetric precautions including but not limited to vaginal bleeding, contractions, leaking of fluid and fetal movement were reviewed in detail with the patient. Please refer to After Visit Summary for other counseling recommendations.   No follow-ups on file.  Future Appointments  Date Time Provider Department Center  03/20/2023  9:50 AM Milas Hock, MD [facility]  03/27/2023  9:50 AM Milas Hock, MD [facility]  03/28/2023 12:30 PM [facility] US6 [facility]  04/03/2023  9:50 AM Milas Hock, MD [facility]  04/04/2023 10:30 AM [facility] US6 [facility]  04/10/2023  9:50 AM Lennart Pall, MD [facility]    Milas Hock, MD

## 2023-03-20 ENCOUNTER — Ambulatory Visit: Payer: BC Managed Care – PPO | Admitting: Obstetrics and Gynecology

## 2023-03-20 ENCOUNTER — Other Ambulatory Visit (HOSPITAL_COMMUNITY)
Admission: RE | Admit: 2023-03-20 | Discharge: 2023-03-20 | Disposition: A | Discharge status: Home / Self Care | Payer: BC Managed Care – PPO | Source: Ambulatory Visit | Attending: Obstetrics and Gynecology | Admitting: Obstetrics and Gynecology

## 2023-03-20 ENCOUNTER — Encounter: Payer: Self-pay | Admitting: Obstetrics and Gynecology

## 2023-03-20 VITALS — BP 136/88 | HR 108 | Wt 243.0 lb

## 2023-03-20 DIAGNOSIS — O09522 Supervision of elderly multigravida, second trimester: Secondary | ICD-10-CM

## 2023-03-20 DIAGNOSIS — Z3A36 36 weeks gestation of pregnancy: Secondary | ICD-10-CM

## 2023-03-20 DIAGNOSIS — Z3480 Encounter for supervision of other normal pregnancy, unspecified trimester: Secondary | ICD-10-CM | POA: Diagnosis not present

## 2023-03-20 DIAGNOSIS — O99212 Obesity complicating pregnancy, second trimester: Secondary | ICD-10-CM

## 2023-03-20 DIAGNOSIS — O99213 Obesity complicating pregnancy, third trimester: Secondary | ICD-10-CM

## 2023-03-20 DIAGNOSIS — Z1332 Encounter for screening for maternal depression: Secondary | ICD-10-CM | POA: Diagnosis not present

## 2023-03-20 DIAGNOSIS — O43893 Other placental disorders, third trimester: Secondary | ICD-10-CM

## 2023-03-20 DIAGNOSIS — O09899 Supervision of other high risk pregnancies, unspecified trimester: Secondary | ICD-10-CM

## 2023-03-20 DIAGNOSIS — E669 Obesity, unspecified: Secondary | ICD-10-CM

## 2023-03-20 DIAGNOSIS — O09523 Supervision of elderly multigravida, third trimester: Secondary | ICD-10-CM

## 2023-03-20 DIAGNOSIS — O409XX0 Polyhydramnios, unspecified trimester, not applicable or unspecified: Secondary | ICD-10-CM

## 2023-03-20 NOTE — Progress Notes (Signed)
Pt requested no student

## 2023-03-21 LAB — CERVICOVAGINAL ANCILLARY ONLY
Chlamydia: NEGATIVE
Comment: NEGATIVE
Comment: NORMAL
Neisseria Gonorrhea: NEGATIVE

## 2023-03-24 NOTE — Progress Notes (Unsigned)
   PRENATAL VISIT NOTE  Subjective:  Julia Acosta is a 37 y.o. G3P2002 at [redacted]w[redacted]d being seen today for ongoing prenatal care.  She is currently monitored for the following issues for this low-risk pregnancy and has Supervision of other normal pregnancy, antepartum; Obesity affecting pregnancy; AMA (advanced maternal age) multigravida 35+; Single umbilical artery affecting management of mother in singleton pregnancy, antepartum; Polyhydramnios affecting pregnancy - RESOLVED as of 11/5; and Gestational hypertension on their problem list.  Patient reports  tailbone pain with sitting .  Contractions: Not present. Vag. Bleeding: None.  Movement: Present. Denies leaking of fluid.   The following portions of the patient's history were reviewed and updated as appropriate: allergies, current medications, past family history, past medical history, past social history, past surgical history and problem list.   Objective:   Vitals:   03/27/23 0955 03/27/23 1023  BP: (!) 138/91 (!) 135/93  Pulse: 87 86  Weight: 246 lb (111.6 kg)     Fetal Status: Fetal Heart Rate (bpm): 138   Movement: Present     General:  Alert, oriented and cooperative. Patient is in no acute distress.  Skin: Skin is warm and dry. No rash noted.   Cardiovascular: Normal heart rate noted  Respiratory: Normal respiratory effort, no problems with respiration noted  Abdomen: Soft, gravid, appropriate for gestational age.  Pain/Pressure: Present     Pelvic: Cervical exam deferred        Extremities: Normal range of motion.  Edema: None  Mental Status: Normal mood and affect. Normal behavior. Normal judgment and thought content.   Assessment and Plan:  Pregnancy: G3P2002 at [redacted]w[redacted]d 1. Single umbilical artery affecting management of mother in singleton pregnancy, antepartum Growth 92%ile, normal AFI, AC >99%ile. Due to LGA combined with SUA, AMA and elevated BMI, MFM recommends delivery at 39w. Reviewed with pt - scheduled for 39w if  she makes it that far.   2. Supervision of other normal pregnancy, antepartum BP today 138/91. Recheck was 135/93 Last appt was 138/92. Recheck was 136/88.  - Scheduled for IOL at [place] for GHTN GBS neg  3. Obesity affecting pregnancy in second trimester, unspecified obesity type  4. Multigravida of advanced maternal age in second trimester  5. Pregnancy with 37 weeks completed gestation   Term labor symptoms and general obstetric precautions including but not limited to vaginal bleeding, contractions, leaking of fluid and fetal movement were reviewed in detail with the patient. Please refer to After Visit Summary for other counseling recommendations.   Return in about 1 week (around 04/03/2023) for OB VISIT, MD or APP.  Future Appointments  Date Time Provider Department Center  03/28/2023 12:00 AM [facility]-LD SCHED ROOM [facility]INDC None  04/25/2023 11:10 AM Milas Hock, MD [facility]    Milas Hock, MD

## 2023-03-25 LAB — CULTURE, BETA STREP (GROUP B ONLY): Strep Gp B Culture: NEGATIVE

## 2023-03-27 ENCOUNTER — Ambulatory Visit (INDEPENDENT_AMBULATORY_CARE_PROVIDER_SITE_OTHER): Payer: BC Managed Care – PPO | Admitting: Obstetrics and Gynecology

## 2023-03-27 ENCOUNTER — Encounter: Payer: Self-pay | Admitting: Obstetrics and Gynecology

## 2023-03-27 ENCOUNTER — Other Ambulatory Visit: Payer: Self-pay | Admitting: Advanced Practice Midwife

## 2023-03-27 ENCOUNTER — Other Ambulatory Visit: Payer: Self-pay | Admitting: Obstetrics and Gynecology

## 2023-03-27 VITALS — BP 135/93 | HR 86 | Wt 246.0 lb

## 2023-03-27 DIAGNOSIS — Z3480 Encounter for supervision of other normal pregnancy, unspecified trimester: Secondary | ICD-10-CM

## 2023-03-27 DIAGNOSIS — O139 Gestational [pregnancy-induced] hypertension without significant proteinuria, unspecified trimester: Secondary | ICD-10-CM | POA: Insufficient documentation

## 2023-03-27 DIAGNOSIS — O99212 Obesity complicating pregnancy, second trimester: Secondary | ICD-10-CM

## 2023-03-27 DIAGNOSIS — O09899 Supervision of other high risk pregnancies, unspecified trimester: Secondary | ICD-10-CM

## 2023-03-27 DIAGNOSIS — O133 Gestational [pregnancy-induced] hypertension without significant proteinuria, third trimester: Secondary | ICD-10-CM

## 2023-03-27 DIAGNOSIS — Z3A37 37 weeks gestation of pregnancy: Secondary | ICD-10-CM

## 2023-03-27 DIAGNOSIS — O09522 Supervision of elderly multigravida, second trimester: Secondary | ICD-10-CM

## 2023-03-27 MED ORDER — BREAST PUMP MISC: 0 refills | Status: AC

## 2023-03-27 NOTE — Patient Instructions (Signed)
Congratulations, you're on your way to having your baby!!!   You now have an induction scheduled.   If your induction is scheduled for midnight, you will arrive at 11:45pm on the date provided on the form form the clinical staff today. You will arrive at the [facility] at [facility] (Entrance C) and tell them you are there for your induction. The hospital will only call you to not come if they have NO beds available and need to postpone your admission time.

## 2023-03-28 ENCOUNTER — Ambulatory Visit: Payer: BC Managed Care – PPO

## 2023-03-28 ENCOUNTER — Other Ambulatory Visit: Payer: Self-pay

## 2023-03-28 ENCOUNTER — Inpatient Hospital Stay (HOSPITAL_COMMUNITY)
Admission: RE | Admit: 2023-03-28 | Discharge: 2023-03-31 | DRG: 807 | Disposition: A | Discharge status: Home / Self Care | Payer: BC Managed Care – PPO | Attending: Obstetrics and Gynecology | Admitting: Obstetrics and Gynecology

## 2023-03-28 ENCOUNTER — Inpatient Hospital Stay (HOSPITAL_COMMUNITY): Payer: BC Managed Care – PPO

## 2023-03-28 ENCOUNTER — Encounter (HOSPITAL_COMMUNITY): Payer: Self-pay | Admitting: Obstetrics and Gynecology

## 2023-03-28 DIAGNOSIS — Z882 Allergy status to sulfonamides status: Secondary | ICD-10-CM | POA: Diagnosis not present

## 2023-03-28 DIAGNOSIS — Z8249 Family history of ischemic heart disease and other diseases of the circulatory system: Secondary | ICD-10-CM | POA: Diagnosis not present

## 2023-03-28 DIAGNOSIS — O09523 Supervision of elderly multigravida, third trimester: Secondary | ICD-10-CM | POA: Diagnosis not present

## 2023-03-28 DIAGNOSIS — O134 Gestational [pregnancy-induced] hypertension without significant proteinuria, complicating childbirth: Secondary | ICD-10-CM | POA: Diagnosis not present

## 2023-03-28 DIAGNOSIS — Z3A37 37 weeks gestation of pregnancy: Secondary | ICD-10-CM | POA: Diagnosis not present

## 2023-03-28 DIAGNOSIS — Z3480 Encounter for supervision of other normal pregnancy, unspecified trimester: Secondary | ICD-10-CM

## 2023-03-28 DIAGNOSIS — Z7982 Long term (current) use of aspirin: Secondary | ICD-10-CM

## 2023-03-28 DIAGNOSIS — O09529 Supervision of elderly multigravida, unspecified trimester: Secondary | ICD-10-CM

## 2023-03-28 DIAGNOSIS — O133 Gestational [pregnancy-induced] hypertension without significant proteinuria, third trimester: Principal | ICD-10-CM

## 2023-03-28 DIAGNOSIS — Z833 Family history of diabetes mellitus: Secondary | ICD-10-CM

## 2023-03-28 DIAGNOSIS — Z87440 Personal history of urinary (tract) infections: Secondary | ICD-10-CM | POA: Diagnosis not present

## 2023-03-28 DIAGNOSIS — Z83438 Family history of other disorder of lipoprotein metabolism and other lipidemia: Secondary | ICD-10-CM | POA: Diagnosis not present

## 2023-03-28 DIAGNOSIS — O99214 Obesity complicating childbirth: Secondary | ICD-10-CM | POA: Diagnosis present

## 2023-03-28 DIAGNOSIS — O99210 Obesity complicating pregnancy, unspecified trimester: Secondary | ICD-10-CM | POA: Diagnosis present

## 2023-03-28 DIAGNOSIS — O139 Gestational [pregnancy-induced] hypertension without significant proteinuria, unspecified trimester: Principal | ICD-10-CM | POA: Diagnosis present

## 2023-03-28 DIAGNOSIS — O09899 Supervision of other high risk pregnancies, unspecified trimester: Secondary | ICD-10-CM

## 2023-03-28 HISTORY — DX: Gestational (pregnancy-induced) hypertension without significant proteinuria, unspecified trimester: O13.9

## 2023-03-28 LAB — RPR: RPR Ser Ql: NONREACTIVE

## 2023-03-28 LAB — COMPREHENSIVE METABOLIC PANEL
ALT: 15 U/L (ref 0–44)
AST: 17 U/L (ref 15–41)
Albumin: 2.9 g/dL — ABNORMAL LOW (ref 3.5–5.0)
Alkaline Phosphatase: 109 U/L (ref 38–126)
Anion gap: 10 (ref 5–15)
BUN: 9 mg/dL (ref 6–20)
CO2: 17 mmol/L — ABNORMAL LOW (ref 22–32)
Calcium: 8.9 mg/dL (ref 8.9–10.3)
Chloride: 104 mmol/L (ref 98–111)
Creatinine, Ser: 0.57 mg/dL (ref 0.44–1.00)
GFR, Estimated: >60 mL/min (ref >60)
Glucose, Bld: 87 mg/dL (ref 70–99)
Potassium: 3.3 mmol/L — ABNORMAL LOW (ref 3.5–5.1)
Sodium: 131 mmol/L — ABNORMAL LOW (ref 135–145)
Total Bilirubin: 0.5 mg/dL (ref <1.2)
Total Protein: 6.7 g/dL (ref 6.5–8.1)

## 2023-03-28 LAB — TYPE AND SCREEN
ABO/RH(D): O POS
Antibody Screen: NEGATIVE

## 2023-03-28 LAB — PROTEIN / CREATININE RATIO, URINE
Creatinine, Urine: 51 mg/dL
Protein Creatinine Ratio: 0.12 mg/mg{creat} (ref 0.00–0.15)
Total Protein, Urine: 6 mg/dL

## 2023-03-28 LAB — CBC
HCT: 34.4 % — ABNORMAL LOW (ref 36.0–46.0)
Hemoglobin: 11.2 g/dL — ABNORMAL LOW (ref 12.0–15.0)
MCH: 27.2 pg (ref 26.0–34.0)
MCHC: 32.6 g/dL (ref 30.0–36.0)
MCV: 83.5 fL (ref 80.0–100.0)
Platelets: 274 10*3/uL (ref 150–400)
RBC: 4.12 MIL/uL (ref 3.87–5.11)
RDW: 15 % (ref 11.5–15.5)
WBC: 8.7 10*3/uL (ref 4.0–10.5)
nRBC: 0 % (ref 0.0–0.2)

## 2023-03-28 MED ORDER — MISOPROSTOL 50MCG HALF TABLET
50.0000 ug | ORAL_TABLET | ORAL | Status: DC
  Administered 2023-03-28 (×2): 50 ug via ORAL
  Filled 2023-03-28 (×2): qty 1

## 2023-03-28 MED ORDER — OXYTOCIN-SODIUM CHLORIDE 30-0.9 UT/500ML-% IV SOLN
1.0000 m[IU]/min | INTRAVENOUS | Status: DC
  Administered 2023-03-28: 2 m[IU]/min via INTRAVENOUS

## 2023-03-28 MED ORDER — FENTANYL CITRATE (PF) 100 MCG/2ML IJ SOLN
50.0000 ug | INTRAMUSCULAR | Status: DC | PRN
  Administered 2023-03-28: 50 ug via INTRAVENOUS
  Administered 2023-03-29 (×4): 100 ug via INTRAVENOUS
  Filled 2023-03-28 (×5): qty 2

## 2023-03-28 MED ORDER — TERBUTALINE SULFATE 1 MG/ML IJ SOLN: 0.2500 mg | Freq: Once | INTRAMUSCULAR | Status: DC | PRN

## 2023-03-28 MED ORDER — OXYTOCIN-SODIUM CHLORIDE 30-0.9 UT/500ML-% IV SOLN
2.5000 [IU]/h | INTRAVENOUS | Status: DC
  Administered 2023-03-29: 2.5 [IU]/h via INTRAVENOUS
  Filled 2023-03-28: qty 500

## 2023-03-28 MED ORDER — ONDANSETRON HCL 4 MG/2ML IJ SOLN
4.0000 mg | Freq: Four times a day (QID) | INTRAMUSCULAR | Status: DC | PRN
  Administered 2023-03-29: 4 mg via INTRAVENOUS
  Filled 2023-03-28: qty 2

## 2023-03-28 MED ORDER — LACTATED RINGERS IV SOLN: 500.0000 mL | INTRAVENOUS | Status: DC | PRN

## 2023-03-28 MED ORDER — ACETAMINOPHEN 325 MG PO TABS: 650.0000 mg | ORAL_TABLET | ORAL | Status: DC | PRN

## 2023-03-28 MED ORDER — OXYTOCIN BOLUS FROM INFUSION
333.0000 mL | Freq: Once | INTRAVENOUS | Status: AC
  Administered 2023-03-29: 333 mL via INTRAVENOUS

## 2023-03-28 MED ORDER — LACTATED RINGERS IV SOLN: INTRAVENOUS | Status: DC

## 2023-03-28 MED ORDER — MISOPROSTOL 50MCG HALF TABLET
50.0000 ug | ORAL_TABLET | Freq: Once | ORAL | Status: AC
  Administered 2023-03-28: 50 ug via ORAL
  Filled 2023-03-28: qty 1

## 2023-03-28 MED ORDER — SOD CITRATE-CITRIC ACID 500-334 MG/5ML PO SOLN: 30.0000 mL | ORAL | Status: DC | PRN

## 2023-03-28 MED ORDER — MISOPROSTOL 25 MCG QUARTER TABLET
25.0000 ug | ORAL_TABLET | Freq: Once | ORAL | Status: AC
  Administered 2023-03-28: 25 ug via VAGINAL
  Filled 2023-03-28: qty 1

## 2023-03-28 MED ORDER — LIDOCAINE HCL (PF) 1 % IJ SOLN: 30.0000 mL | INTRAMUSCULAR | Status: DC | PRN

## 2023-03-28 NOTE — Plan of Care (Signed)
  Problem: Education: Goal: Knowledge of General Education information will improve Description: Including pain rating scale, medication(s)/side effects and non-pharmacologic comfort measures Outcome: Progressing   Problem: Health Behavior/Discharge Planning: Goal: Ability to manage health-related needs will improve Outcome: Progressing   Problem: Clinical Measurements: Goal: Ability to maintain clinical measurements within normal limits will improve Outcome: Progressing Goal: Will remain free from infection Outcome: Progressing Goal: Diagnostic test results will improve Outcome: Progressing Goal: Respiratory complications will improve Outcome: Progressing Goal: Cardiovascular complication will be avoided Outcome: Progressing   Problem: Activity: Goal: Risk for activity intolerance will decrease Outcome: Progressing   Problem: Nutrition: Goal: Adequate nutrition will be maintained Outcome: Progressing   Problem: Coping: Goal: Level of anxiety will decrease Outcome: Progressing   Problem: Elimination: Goal: Will not experience complications related to bowel motility Outcome: Progressing Goal: Will not experience complications related to urinary retention Outcome: Progressing   Problem: Pain Management: Goal: General experience of comfort will improve Outcome: Progressing   Problem: Safety: Goal: Ability to remain free from injury will improve Outcome: Progressing   Problem: Skin Integrity: Goal: Risk for impaired skin integrity will decrease Outcome: Progressing   Problem: Education: Goal: Knowledge of Childbirth will improve Outcome: Progressing Goal: Ability to make informed decisions regarding treatment and plan of care will improve Outcome: Progressing Goal: Ability to state and carry out methods to decrease the pain will improve Outcome: Progressing Goal: Individualized Educational Video(s) Outcome: Progressing   Problem: Coping: Goal: Ability to  verbalize concerns and feelings about labor and delivery will improve Outcome: Progressing   Problem: Life Cycle: Goal: Ability to make normal progression through stages of labor will improve Outcome: Progressing Goal: Ability to effectively push during vaginal delivery will improve Outcome: Progressing   Problem: Role Relationship: Goal: Will demonstrate positive interactions with the child Outcome: Progressing   Problem: Safety: Goal: Risk of complications during labor and delivery will decrease Outcome: Progressing   Problem: Pain Management: Goal: Relief or control of pain from uterine contractions will improve Outcome: Progressing

## 2023-03-28 NOTE — Progress Notes (Signed)
Julia Acosta is a 37 y.o. G3P2002 at [redacted]w[redacted]d admitted for induction of labor due to [facility].  Subjective: Pt feeling irregular cramping, s/o in room for support.   Objective: BP (!) 146/94   Pulse 77   Temp (!) 97.4 F (36.3 C) (Axillary)   Resp 16   Ht 5\' 2"  (1.575 m)   Wt 111.6 kg   LMP 07/09/2022   BMI 44.99 kg/m  No intake/output data recorded. No intake/output data recorded.  FHT:  FHR: 135 bpm, variability: moderate,  accelerations:  Present,  decelerations:  Absent UC:   irregular, every 2-4 minutes Dilation: 1 Effacement (%): 60 Cervical Position: Middle Station: -3 Presentation: Vertex Exam by:: Sharen Counter, CNM  Foley balloon placed with some difficulty, pt tolerated well.  Filled to 60 cc by RN.   Labs: Lab Results  Component Value Date   WBC 8.7 03/28/2023   HGB 11.2 (L) 03/28/2023   HCT 34.4 (L) 03/28/2023   MCV 83.5 03/28/2023   PLT 274 03/28/2023    Assessment / Plan: Induction of labor due to [facility] S/p Cytotec and low dose Pitocin Foley balloon in place  Labor: Progressing normally Preeclampsia:  labs stable Fetal Wellbeing:  Category I Pain Control:  Labor support without medications I/D:   GBS neg Anticipated MOD:  NSVD  Sharen Counter, CNM 03/28/2023, 7:48 PM

## 2023-03-28 NOTE — Progress Notes (Signed)
Patient Vitals for the past 4 hrs:  BP Temp Temp src Pulse Resp  03/28/23 1951 (!) 140/96 98.7 F (37.1 C) Oral 81 16  03/28/23 1830 (!) 146/94 (!) 97.4 F (36.3 C) Axillary 77 --   Ctx are getting stronger per pt.  FHR 130s, mod variability, + accels, no decels.  Ctx mild and irregular, q 2-5 minutes.  Balloon still in. Will titrate pitocin up. Plan AROM when balloon falls out.

## 2023-03-28 NOTE — H&P (Signed)
Julia Acosta is a 37 y.o. G64P2002 female at [redacted]w[redacted]d by LMP c/w [redacted]w[redacted]d u/s presenting for IOL due to gHTN dx in the office yesterday.  Reports active fetal movement, contractions: irreg, mild; vaginal bleeding: none, membranes: intact.  Initiated prenatal care at [facility] at 10 wks.   Most recent u/s : [redacted]w[redacted]d, EFW 92% (3360g), post placenta, cephalic, AFI 8.6cm.   This pregnancy complicated by: # gHTN dx at 37.2wks # SUA # AMA # BMI >40  Prenatal History/Complications:  # term vag del x 2 without complication (2017 & 2019)  Past Medical History: Past Medical History:  Diagnosis Date   Bartholin cyst    History of UTI    Polycystic ovaries 05/26/2020   Pregnancy induced hypertension     Past Surgical History: Past Surgical History:  Procedure Laterality Date   ABCESS DRAINAGE     Bartholin cyst    Obstetrical History: OB History     Gravida  3   Para  2   Term  2   Preterm      AB      Living  2      SAB      IAB      Ectopic      Multiple  0   Live Births  2           Social History: Social History   Socioeconomic History   Marital status: Married    Spouse name: Not on file   Number of children: Not on file   Years of education: Not on file   Highest education level: Not on file  Occupational History   Not on file  Tobacco Use   Smoking status: Never   Smokeless tobacco: Never  Vaping Use   Vaping status: Never Used  Substance and Sexual Activity   Alcohol use: No    Alcohol/week: 0.0 standard drinks of alcohol   Drug use: No   Sexual activity: Yes    Birth control/protection: None  Other Topics Concern   Not on file  Social History Narrative   Not on file   Social Determinants of Health   Financial Resource Strain: Not on file  Food Insecurity: No Food Insecurity (03/28/2023)   Hunger Vital Sign    Worried About Running Out of Food in the Last Year: Never true    Ran Out of Food in the Last Year: Never true  Transportation  Needs: No Transportation Needs (03/28/2023)   PRAPARE - Administrator, Civil Service (Medical): No    Lack of Transportation (Non-Medical): No  Physical Activity: Not on file  Stress: Not on file  Social Connections: Not on file    Family History: Family History  Problem Relation Age of Onset   Diabetes Mother    Hypertension Mother    Hypertension Father    Diabetes Father    Hyperlipidemia Father    Cancer Neg Hx    Stroke Neg Hx     Allergies: Allergies  Allergen Reactions   Elemental Sulfur Hives    Medications Prior to Admission  Medication Sig Dispense Refill Last Dose   aspirin EC 81 MG tablet Take 1 tablet (81 mg total) by mouth daily. Swallow whole. 30 tablet 12    famotidine (PEPCID) 20 MG tablet Take 1 tablet (20 mg total) by mouth 2 (two) times daily. 90 tablet 1    hydrOXYzine (VISTARIL) 25 MG capsule Take 1-2 capsules (25-50 mg total) by mouth at  bedtime as needed. 30 capsule 1    Misc. Devices (BREAST PUMP) MISC Dispense one breast pump for patient 1 each 0    Prenatal Vit-Fe Fumarate-FA (MULTIVITAMIN-PRENATAL) 27-0.8 MG TABS tablet Take 1 tablet by mouth daily at 12 noon.       Review of Systems  Pertinent pos/neg as indicated in HPI  Blood pressure (!) 144/91, pulse 81, temperature 98.7 F (37.1 C), temperature source Oral, resp. rate 15, height 5\' 2"  (1.575 m), weight 111.6 kg, last menstrual period 07/09/2022, unknown if currently breastfeeding. General appearance:  appears sleeping Lungs: normal rate of resp, unlabored Heart: regular rate and rhythm Abdomen: gravid Extremities: 1+ edema  Fetal monitoring: FHR: 125-135 bpm, variability: moderate,  Accelerations: Present,  decelerations:  Absent Uterine activity: rare, mild Dilation: Fingertip Effacement (%): 60 Station: Ballotable Exam by:: Mellody Dance RN Presentation: cephalic   Prenatal labs: ABO, Rh: --/--/O POS (11/14 0007) Antibody: NEG (11/14 0007) Rubella: 1.36 (05/07  1449) RPR: Non Reactive (09/13 0904)  HBsAg: Negative (05/07 1449)  HIV: Non Reactive (09/13 0904)  GBS: Negative/-- (11/06 0951)  2hr GTT: 72/122/93  Prenatal Transfer Tool  Maternal Diabetes: No Genetic Screening: Normal Maternal Ultrasounds/Referrals: Other: single umb artery Fetal Ultrasounds or other Referrals:  Referred to Materal Fetal Medicine  Maternal Substance Abuse:  No Significant Maternal Medications:  None Significant Maternal Lab Results: Group B Strep negative  Results for orders placed or performed during the hospital encounter of 03/28/23 (from the past 24 hour(s))  Type and screen   Collection Time: 03/28/23 12:07 AM  Result Value Ref Range   ABO/RH(D) O POS    Antibody Screen NEG    Sample Expiration      03/31/2023,2359 Performed at [facility], [address], [place], [place] [address]   CBC   Collection Time: 03/28/23 12:49 AM  Result Value Ref Range   WBC 8.7 4.0 - 10.5 K/uL   RBC 4.12 3.87 - 5.11 MIL/uL   Hemoglobin 11.2 (L) 12.0 - 15.0 g/dL   HCT 60.4 (L) 54.0 - 98.1 %   MCV 83.5 80.0 - 100.0 fL   MCH 27.2 26.0 - 34.0 pg   MCHC 32.6 30.0 - 36.0 g/dL   RDW 19.1 47.8 - 29.5 %   Platelets 274 150 - 400 K/uL   nRBC 0.0 0.0 - 0.2 %  Comprehensive metabolic panel   Collection Time: 03/28/23 12:49 AM  Result Value Ref Range   Sodium 131 (L) 135 - 145 mmol/L   Potassium 3.3 (L) 3.5 - 5.1 mmol/L   Chloride 104 98 - 111 mmol/L   CO2 17 (L) 22 - 32 mmol/L   Glucose, Bld 87 70 - 99 mg/dL   BUN 9 6 - 20 mg/dL   Creatinine, Ser 6.21 0.44 - 1.00 mg/dL   Calcium 8.9 8.9 - 30.8 mg/dL   Total Protein 6.7 6.5 - 8.1 g/dL   Albumin 2.9 (L) 3.5 - 5.0 g/dL   AST 17 15 - 41 U/L   ALT 15 0 - 44 U/L   Alkaline Phosphatase 109 38 - 126 U/L   Total Bilirubin 0.5 <1.2 mg/dL   GFR, Estimated >65 >78 mL/min   Anion gap 10 5 - 15     Assessment:  [redacted]w[redacted]d SIUP  G3P2002  gHTN  Cat 1 FHR  GBS Negative/-- (11/06 0951)  Plan:  Admit to L&D  IV pain  meds/epidural prn active labor  Received dual dose of cytotec for cx ripening; after 4h  will determine plan of care including possibility of repeat cytotec, cervical foley, or Pitocin  Anticipate vag delivery   Plans to breastfeed  Contraception: condoms  Circumcision: yes  Arabella Merles CNM 03/28/2023, 2:09 AM

## 2023-03-28 NOTE — Progress Notes (Signed)
Labor Progress Note Julia Acosta is a 37 y.o. G3P2002 at [redacted]w[redacted]d presented for IOL for gHTN  S: Not having any contractions  O:  BP 122/76   Pulse 87   Temp 98 F (36.7 C) (Oral)   Resp 16   Ht 5\' 2"  (1.575 m)   Wt 111.6 kg   LMP 07/09/2022   BMI 44.99 kg/m  EFM: 130/moderate/+accels, no decels  CVE: Dilation: 1 Effacement (%): 60 Station: -3 Presentation: Vertex Exam by:: Dr. Alvester Morin   A&P: 37 y.o. [id number] [redacted]w[redacted]d IOL for GHTN #Labor: Progressing well but needs more ripening.Pitocin 1x1 to 6u. Possible FB.  #Pain: Coping well. PRN medications and if epidural desired OK to receive #FWB: Cat1 #GBS negative  Federico Flake, MD 2:28 PM

## 2023-03-28 NOTE — Progress Notes (Signed)
Patient ID: Julia Acosta, female   DOB: Mar 05, 1986, 37 y.o.   MRN: [id number]  Was able to rest overnight; s/p two doses of cytotec, one of them dual; feels some cramping; no s/s pre-e  BPs 123/62, 156/100, 134/83 FHR 130s, +accels, no decels, occ mi variables Ctx irreg Cx deferred (FT/60/ballot per RN exam)  IUP@37 .3wks gHTN IOL process  -Plan for cx exam 4h after last cytotec to determine plan (foley, Pit, another cytotec) -Pt understands and is agreeable -Anticipate vag delivery  Arabella Merles CNM 03/28/2023 7:11 AM

## 2023-03-29 ENCOUNTER — Inpatient Hospital Stay (HOSPITAL_COMMUNITY): Payer: BC Managed Care – PPO | Admitting: Anesthesiology

## 2023-03-29 ENCOUNTER — Encounter (HOSPITAL_COMMUNITY): Payer: Self-pay | Admitting: Obstetrics and Gynecology

## 2023-03-29 DIAGNOSIS — Z3A37 37 weeks gestation of pregnancy: Secondary | ICD-10-CM

## 2023-03-29 DIAGNOSIS — O09523 Supervision of elderly multigravida, third trimester: Secondary | ICD-10-CM

## 2023-03-29 DIAGNOSIS — O134 Gestational [pregnancy-induced] hypertension without significant proteinuria, complicating childbirth: Secondary | ICD-10-CM

## 2023-03-29 LAB — BIRTH TISSUE RECOVERY COLLECTION (PLACENTA DONATION)

## 2023-03-29 LAB — CBC WITH DIFFERENTIAL/PLATELET
Abs Immature Granulocytes: 0.08 10*3/uL — ABNORMAL HIGH (ref 0.00–0.07)
Basophils Absolute: 0 10*3/uL (ref 0.0–0.1)
Basophils Relative: 0 %
Eosinophils Absolute: 0 10*3/uL (ref 0.0–0.5)
Eosinophils Relative: 0 %
HCT: 36 % (ref 36.0–46.0)
Hemoglobin: 12.1 g/dL (ref 12.0–15.0)
Immature Granulocytes: 1 %
Lymphocytes Relative: 9 %
Lymphs Abs: 1.2 10*3/uL (ref 0.7–4.0)
MCH: 28.1 pg (ref 26.0–34.0)
MCHC: 33.6 g/dL (ref 30.0–36.0)
MCV: 83.7 fL (ref 80.0–100.0)
Monocytes Absolute: 0.7 10*3/uL (ref 0.1–1.0)
Monocytes Relative: 5 %
Neutro Abs: 11.9 10*3/uL — ABNORMAL HIGH (ref 1.7–7.7)
Neutrophils Relative %: 85 %
Platelets: 259 10*3/uL (ref 150–400)
RBC: 4.3 MIL/uL (ref 3.87–5.11)
RDW: 15.2 % (ref 11.5–15.5)
WBC: 14 10*3/uL — ABNORMAL HIGH (ref 4.0–10.5)
nRBC: 0 % (ref 0.0–0.2)

## 2023-03-29 MED ORDER — ONDANSETRON HCL 4 MG/2ML IJ SOLN: 4.0000 mg | INTRAMUSCULAR | Status: DC | PRN

## 2023-03-29 MED ORDER — TETANUS-DIPHTH-ACELL PERTUSSIS 5-2.5-18.5 LF-MCG/0.5 IM SUSY: 0.5000 mL | PREFILLED_SYRINGE | Freq: Once | INTRAMUSCULAR | Status: DC

## 2023-03-29 MED ORDER — ZOLPIDEM TARTRATE 5 MG PO TABS: 5.0000 mg | ORAL_TABLET | Freq: Every evening | ORAL | Status: DC | PRN

## 2023-03-29 MED ORDER — PHENYLEPHRINE 80 MCG/ML (10ML) SYRINGE FOR IV PUSH (FOR BLOOD PRESSURE SUPPORT): 80.0000 ug | PREFILLED_SYRINGE | INTRAVENOUS | Status: DC | PRN

## 2023-03-29 MED ORDER — LACTATED RINGERS IV SOLN
500.0000 mL | Freq: Once | INTRAVENOUS | Status: AC
  Administered 2023-03-29: 500 mL via INTRAVENOUS

## 2023-03-29 MED ORDER — FENTANYL-BUPIVACAINE-NACL 0.5-0.125-0.9 MG/250ML-% EP SOLN: 12.0000 mL/h | EPIDURAL | Status: DC | PRN

## 2023-03-29 MED ORDER — COCONUT OIL OIL: 1.0000 | TOPICAL_OIL | Status: DC | PRN

## 2023-03-29 MED ORDER — DIPHENHYDRAMINE HCL 50 MG/ML IJ SOLN: 12.5000 mg | INTRAMUSCULAR | Status: DC | PRN

## 2023-03-29 MED ORDER — LIDOCAINE HCL (PF) 1 % IJ SOLN
INTRAMUSCULAR | Status: DC | PRN
  Administered 2023-03-29: 8 mL via EPIDURAL

## 2023-03-29 MED ORDER — EPHEDRINE 5 MG/ML INJ: 10.0000 mg | INTRAVENOUS | Status: DC | PRN

## 2023-03-29 MED ORDER — DIBUCAINE (PERIANAL) 1 % EX OINT: 1.0000 | TOPICAL_OINTMENT | CUTANEOUS | Status: DC | PRN

## 2023-03-29 MED ORDER — SENNOSIDES-DOCUSATE SODIUM 8.6-50 MG PO TABS
2.0000 | ORAL_TABLET | Freq: Every day | ORAL | Status: DC
Start: 2023-03-30 — End: 2023-03-31
  Administered 2023-03-30 – 2023-03-31 (×2): 2 via ORAL
  Filled 2023-03-29 (×2): qty 2

## 2023-03-29 MED ORDER — DIPHENHYDRAMINE HCL 25 MG PO CAPS: 25.0000 mg | ORAL_CAPSULE | Freq: Four times a day (QID) | ORAL | Status: DC | PRN

## 2023-03-29 MED ORDER — ONDANSETRON HCL 4 MG PO TABS: 4.0000 mg | ORAL_TABLET | ORAL | Status: DC | PRN

## 2023-03-29 MED ORDER — SIMETHICONE 80 MG PO CHEW
80.0000 mg | CHEWABLE_TABLET | ORAL | Status: DC | PRN
  Administered 2023-03-31: 80 mg via ORAL
  Filled 2023-03-29: qty 1

## 2023-03-29 MED ORDER — FENTANYL-BUPIVACAINE-NACL 0.5-0.125-0.9 MG/250ML-% EP SOLN
12.0000 mL/h | EPIDURAL | Status: DC | PRN
Start: 2023-03-29 — End: 2023-03-29
  Administered 2023-03-29: 12 mL/h via EPIDURAL
  Filled 2023-03-29: qty 250

## 2023-03-29 MED ORDER — BENZOCAINE-MENTHOL 20-0.5 % EX AERO
1.0000 | INHALATION_SPRAY | CUTANEOUS | Status: DC | PRN
  Administered 2023-03-29: 1 via TOPICAL
  Filled 2023-03-29: qty 56

## 2023-03-29 MED ORDER — FUROSEMIDE 20 MG PO TABS
20.0000 mg | ORAL_TABLET | Freq: Every day | ORAL | Status: DC
  Administered 2023-03-29 – 2023-03-31 (×3): 20 mg via ORAL
  Filled 2023-03-29 (×3): qty 1

## 2023-03-29 MED ORDER — PRENATAL MULTIVITAMIN CH
1.0000 | ORAL_TABLET | Freq: Every day | ORAL | Status: DC
  Administered 2023-03-29 – 2023-03-31 (×3): 1 via ORAL
  Filled 2023-03-29 (×3): qty 1

## 2023-03-29 MED ORDER — WITCH HAZEL-GLYCERIN EX PADS: 1.0000 | MEDICATED_PAD | CUTANEOUS | Status: DC | PRN

## 2023-03-29 MED ORDER — IBUPROFEN 600 MG PO TABS
600.0000 mg | ORAL_TABLET | Freq: Four times a day (QID) | ORAL | Status: DC
Start: 2023-03-29 — End: 2023-03-31
  Administered 2023-03-29 – 2023-03-31 (×9): 600 mg via ORAL
  Filled 2023-03-29 (×9): qty 1

## 2023-03-29 MED ORDER — ACETAMINOPHEN 325 MG PO TABS
650.0000 mg | ORAL_TABLET | ORAL | Status: DC | PRN
  Administered 2023-03-29: 650 mg via ORAL
  Filled 2023-03-29: qty 2

## 2023-03-29 NOTE — Anesthesia Procedure Notes (Signed)
Epidural Patient location during procedure: OB Start time: 03/29/2023 6:43 AM End time: 03/29/2023 6:47 AM  Staffing Anesthesiologist: Bethena Midget, MD  Preanesthetic Checklist Completed: patient identified, IV checked, site marked, risks and benefits discussed, surgical consent, monitors and equipment checked, pre-op evaluation and timeout performed  Epidural Patient position: sitting Prep: DuraPrep and site prepped and draped Patient monitoring: continuous pulse ox and blood pressure Approach: midline Location: L3-L4 Injection technique: LOR air  Needle:  Needle type: Tuohy  Needle gauge: 17 G Needle length: 9 cm and 9 Needle insertion depth: 7 cm Catheter type: closed end flexible Catheter size: 19 Gauge Catheter at skin depth: 13 cm Test dose: negative  Assessment Events: blood not aspirated, no cerebrospinal fluid, injection not painful, no injection resistance, no paresthesia and negative IV test

## 2023-03-29 NOTE — Anesthesia Preprocedure Evaluation (Addendum)
Anesthesia Evaluation  Patient identified by MRN, date of birth, ID band Patient awake    Reviewed: Allergy & Precautions, H&P , NPO status , Patient's Chart, lab work & pertinent test results  Airway Mallampati: II  TM Distance: >3 FB Neck ROM: Full    Dental no notable dental hx. (+) Teeth Intact, Dental Advisory Given   Pulmonary neg pulmonary ROS   Pulmonary exam normal breath sounds clear to auscultation       Cardiovascular Exercise Tolerance: Good hypertension, Pt. on medications negative cardio ROS Normal cardiovascular exam Rhythm:Regular Rate:Normal     Neuro/Psych negative neurological ROS  negative psych ROS   GI/Hepatic negative GI ROS, Neg liver ROS,,,  Endo/Other  negative endocrine ROS    Renal/GU negative Renal ROS  negative genitourinary   Musculoskeletal negative musculoskeletal ROS (+)    Abdominal   Peds negative pediatric ROS (+)  Hematology negative hematology ROS (+)   Anesthesia Other Findings   Reproductive/Obstetrics negative OB ROS                             Anesthesia Physical Anesthesia Plan  ASA: 2  Anesthesia Plan: General   Post-op Pain Management: Epidural*   Induction:   PONV Risk Score and Plan: 3 and Treatment may vary due to age or medical condition  Airway Management Planned: Natural Airway and Simple Face Mask  Additional Equipment: None  Intra-op Plan:   Post-operative Plan:   Informed Consent: I have reviewed the patients History and Physical, chart, labs and discussed the procedure including the risks, benefits and alternatives for the proposed anesthesia with the patient or authorized representative who has indicated his/her understanding and acceptance.     Dental Advisory Given  Plan Discussed with: Anesthesiologist and CRNA  Anesthesia Plan Comments: (Labs checked- platelets confirmed with RN in room. Fetal heart  tracing, per RN, reported to be stable enough for sitting procedure. Discussed epidural, and patient consents to the procedure:  included risk of possible headache,backache, failed block, allergic reaction, and nerve injury. This patient was asked if she had any questions or concerns before the procedure started.)       Anesthesia Quick Evaluation

## 2023-03-29 NOTE — Discharge Summary (Signed)
Postpartum Discharge Summary  Date of Service updated***     Patient Name: Julia Acosta DOB: [birth year] MRN: [id number]  Date of admission: 03/28/2023 Delivery date:03/29/2023 Delivering provider: Sharen Counter A Date of discharge: 03/29/2023  Admitting diagnosis: Gestational hypertension [O13.9] Intrauterine pregnancy: [redacted]w[redacted]d     Secondary diagnosis:  Principal Problem:   Gestational hypertension Active Problems:   Obesity affecting pregnancy   AMA (advanced maternal age) multigravida 35+   Single umbilical artery affecting management of mother in singleton pregnancy, antepartum  Additional problems: ***    Discharge diagnosis: Term Pregnancy Delivered                                              Post partum procedures:{Postpartum procedures:23558} Augmentation: AROM, Pitocin, Cytotec, and IP Foley Complications: None  Hospital course: Induction of Labor With Vaginal Delivery   37 y.o. yo G3P3003 at [redacted]w[redacted]d was admitted to the hospital 03/28/2023 for induction of labor.  Indication for induction: Gestational hypertension.  Patient had an uncomplicated labor course. Membrane Rupture Time/Date: 2:00 AM,03/29/2023  Delivery Method:Vaginal, Spontaneous Operative Delivery:N/A Episiotomy: None Lacerations:  2nd degree;Perineal Details of delivery can be found in separate delivery note.  Patient had a postpartum course complicated by***. Patient is discharged home 03/29/23.  Newborn Data: Birth date:03/29/2023 Birth time:8:24 AM Gender:Female Living status:Living Apgars:8 ,9  Weight:2740 g  Magnesium Sulfate received: No BMZ received: No Rhophylac:No MMR:No T-DaP:Given prenatally Flu: Yes RSV Vaccine received: No Transfusion:No  Immunizations received: Immunization History  Administered Date(s) Administered   Influenza, Seasonal, Injecte, Preservative Fre 03/08/2023   Influenza,inj,Quad PF,6+ Mos 03/14/2015   Tdap 08/11/2015, 06/18/2017, 01/25/2023     Physical exam  Vitals:   03/29/23 0901 03/29/23 0916 03/29/23 0931 03/29/23 0947  BP: 126/74 124/82 135/78 132/66  Pulse: 100 (!) 105 (!) 104 93  Resp:      Temp:      TempSrc:      SpO2:      Weight:      Height:       General: {Exam; general:21111117} Lochia: {Desc; appropriate/inappropriate:30686::"appropriate"} Uterine Fundus: {Desc; firm/soft:30687} Incision: {Exam; incision:21111123} DVT Evaluation: {Exam; dvt:2111122} Labs: Lab Results  Component Value Date   WBC 14.0 (H) 03/29/2023   HGB 12.1 03/29/2023   HCT 36.0 03/29/2023   MCV 83.7 03/29/2023   PLT 259 03/29/2023      Latest Ref Rng & Units 03/28/2023   12:49 AM  CMP  Glucose 70 - 99 mg/dL 87   BUN 6 - 20 mg/dL 9   Creatinine 4.09 - 8.11 mg/dL 9.14   Sodium 782 - 956 mmol/L 131   Potassium 3.5 - 5.1 mmol/L 3.3   Chloride 98 - 111 mmol/L 104   CO2 22 - 32 mmol/L 17   Calcium 8.9 - 10.3 mg/dL 8.9   Total Protein 6.5 - 8.1 g/dL 6.7   Total Bilirubin <2.1 mg/dL 0.5   Alkaline Phos 38 - 126 U/L 109   AST 15 - 41 U/L 17   ALT 0 - 44 U/L 15    Edinburgh Score:    10/25/2017   10:29 AM  Edinburgh Postnatal Depression Scale Screening Tool  I have been able to laugh and see the funny side of things. 0  I have looked forward with enjoyment to things. 0  I have blamed myself unnecessarily when things went wrong. 0  I have been anxious or worried for no good reason. 0  I have felt scared or panicky for no good reason. 0  Things have been getting on top of me. 0  I have been so unhappy that I have had difficulty sleeping. 0  I have felt sad or miserable. 0  I have been so unhappy that I have been crying. 0  The thought of harming myself has occurred to me. 0  Edinburgh Postnatal Depression Scale Total 0   No data recorded  After visit meds:  Allergies as of 03/29/2023       Reactions   Elemental Sulfur Hives     Med Rec must be completed prior to using this [facility]***        Discharge  home in stable condition Infant Feeding: {Baby feeding:23562} Infant Disposition:{CHL IP OB HOME WITH ZOXWRU:04540} Discharge instruction: per After Visit Summary and Postpartum booklet. Activity: Advance as tolerated. Pelvic rest for 6 weeks.  Diet: {OB JWJX:[id number]} Future Appointments: Future Appointments  Date Time Provider Department Center  04/25/2023 11:10 AM Milas Hock, MD [facility]   Follow up Visit:  Message sent to [facility] on 03/29/23:  Please schedule this patient for a In person postpartum visit in 6 weeks with the following provider: Any provider. Additional Postpartum F/U:BP check 1 week  High risk pregnancy complicated by: HTN Delivery mode:  Vaginal, Spontaneous Anticipated Birth Control:  Condoms   03/29/2023 Sharen Counter, CNM

## 2023-03-29 NOTE — Progress Notes (Signed)
Patient Vitals for the past 4 hrs:  BP Temp Temp src Pulse Resp  03/29/23 0142 (!) 157/86 -- -- 98 --  03/29/23 0100 (!) 146/104 -- -- (!) 108 --  03/29/23 0001 (!) 151/103 -- -- (!) 113 --  03/28/23 2322 (!) 158/99 98.8 F (37.1 C) Oral 81 18   Doing well w/fentanyl.  Balloon out, cx 6/80/-2. AROM w/clear fluid. FHR Cat 1, ctx q 2-4 minutes.  Pitocin still at 42mu/min.  Will see if AROM augments labor, otherwise will increase pitocin prn.

## 2023-03-29 NOTE — Progress Notes (Signed)
Patient Vitals for the past 4 hrs:  BP Temp Temp src Pulse Resp  03/29/23 0501 125/87 99 F (37.2 C) Oral 93 18  03/29/23 0430 (!) 110/92 -- -- (!) 131 --  03/29/23 0400 (!) 150/102 -- -- 88 --  03/29/23 0330 101/72 -- -- 92 --  03/29/23 0319 120/66 -- -- 89 --  03/29/23 0300 (!) 153/113 -- -- (!) 105 --  03/29/23 0230 (!) 148/96 -- -- (!) 106 --  03/29/23 0142 (!) 157/86 -- -- 98 --   Using IV pain medicine w/good results.  FHR Cat 1.  Ctx q 2-4 minutes.  Cx still 6/80/-1.  Will start increasing pitocin

## 2023-03-29 NOTE — Lactation Note (Signed)
This note was copied from a baby's chart. Lactation Consultation Note  Patient Name: Julia Acosta Date: 03/29/2023 @ 1430  Age:35 hours, P3 , exp BF  Reason for consult: Initial assessment;Early term 37-38.6wks NP asked LC to see dyad and also mom requested a visit.  LC offered to check the diaper and mom receptive.  Med black to mec stool.  And during the diaper change baby spit a med amount of old formula, and then preceded to spit up x 2 of amniotic fluid.  LC reviewed the bulb syringe, and burping.  LC assisted to latch on the right breast and  baby was to sleepy.  LC woke baby up enough to spoon feed th ebaby 3 ml of EBM mom and LC had expressed off the right breast and baby tolerated well. Baby STS on moms chest after the feeding , comfortably sleeping. 3p LC aware to check the flange size when see mom the next consult.  See below for moms situation for her DEBP needed for D/C.    Maternal Data Has patient been taught Hand Expression?: Yes (3 ml of colostrum) Does the patient have breastfeeding experience prior to this delivery?: Yes How long did the patient breastfeed?: per mom BF her 1st baby 18 months and her 2nd baby 15 months  Feeding Mother's Current Feeding Choice: Breast Milk and Formula  LATCH Score Latch: Too sleepy or reluctant, no latch achieved, no sucking elicited.  Audible Swallowing: None  Type of Nipple: Everted at rest and after stimulation  Comfort (Breast/Nipple): Soft / non-tender  Hold (Positioning): Assistance needed to correctly position infant at breast and maintain latch.  LATCH Score: 5   Lactation Tools Discussed/Used Tools: Pump;Flanges Flange Size: Other (comment) (3p LC aware to check the sizing on the flange) Breast pump type: Double-Electric Breast Pump;Manual Pump Education: Setup, frequency, and cleaning;Milk Storage Reason for Pumping: due to ET, OB incompatibility + DAT , and 6-1 oz at  birth.  Interventions Interventions: Breast feeding basics reviewed;Assisted with latch;Skin to skin;Breast massage;Hand express;Reverse pressure;Breast compression;Adjust position;Support pillows;Hand pump;DEBP;Education;LC Services brochure  Discharge Pump:  (per mom has spoke with her [employer] and she was told she will need a precription for her DEBP or hand free pump. LC asked mom to ask her OB provider for precription Saturday.)  Consult Status Consult Status: Follow-up Date: 03/29/23 Follow-up type: In-patient    Matilde Sprang [name] 03/29/2023, 3:35 PM

## 2023-03-29 NOTE — Discharge Summary (Signed)
    Error--a new dc summary was started at DC

## 2023-03-30 MED ORDER — FUROSEMIDE 20 MG PO TABS: 20.0000 mg | ORAL_TABLET | Freq: Every day | ORAL | 0 refills | Status: DC

## 2023-03-30 MED ORDER — NIFEDIPINE ER 30 MG PO TB24: 30.0000 mg | ORAL_TABLET | Freq: Every day | ORAL | 3 refills | Status: AC

## 2023-03-30 MED ORDER — IBUPROFEN 600 MG PO TABS: 600.0000 mg | ORAL_TABLET | Freq: Four times a day (QID) | ORAL | 0 refills | Status: DC

## 2023-03-30 MED ORDER — NIFEDIPINE ER OSMOTIC RELEASE 30 MG PO TB24
30.0000 mg | ORAL_TABLET | Freq: Every day | ORAL | Status: DC
  Administered 2023-03-30 – 2023-03-31 (×2): 30 mg via ORAL
  Filled 2023-03-30 (×2): qty 1

## 2023-03-30 NOTE — Anesthesia Postprocedure Evaluation (Signed)
Anesthesia Post Note  Patient: Contractor  Procedure(s) Performed: AN AD HOC LABOR EPIDURAL     Patient location during evaluation: Mother Baby Anesthesia Type: General Level of consciousness: awake and alert Pain management: pain level controlled Vital Signs Assessment: post-procedure vital signs reviewed and stable Respiratory status: spontaneous breathing, nonlabored ventilation and respiratory function stable Cardiovascular status: stable Postop Assessment: no headache, no backache and epidural receding Anesthetic complications: no   No notable events documented.  Last Vitals:  Vitals:   03/30/23 0006 03/30/23 0458  BP: (!) 118/94 (!) 124/92  Pulse: 87 86  Resp: 18 18  Temp: 36.7 C 36.7 C  SpO2: 99%     Last Pain:  Vitals:   03/30/23 0458  TempSrc: Oral  PainSc: 0-No pain   Pain Goal: Patients Stated Pain Goal: 0 (03/28/23 0040)                 [facility]ica Records

## 2023-03-30 NOTE — Lactation Note (Signed)
This note was copied from a baby's chart. Lactation Consultation Note  Patient Name: Julia Acosta Date: 03/30/2023 Age:35 hours Reason for consult: Follow-up assessment;Early term 37-38.6wks;Breastfeeding assistance;Maternal endocrine disorder  P3, Baby [redacted]w[redacted]d.  Offered to assist with breastfeeding.  Mother states baby recently was given 10 ml of formula with Dr. Theora Gianotti bottle and she gave baby 6 ml of colostrum prior to that.  Declined assistance at this time.  Recommend calling for help with latching as needed tonight.   Maternal Data Has patient been taught Hand Expression?: Yes  Feeding Mother's Current Feeding Choice: Breast Milk and Formula Nipple Type: Dr. Irving Burton Preemie Interventions Interventions: Education Consult Status Consult Status: Follow-up Date: 03/30/23 Follow-up type: In-patient    Dahlia Byes [facility]  RN IBCLC 03/30/2023, 1:40 PM

## 2023-03-30 NOTE — Progress Notes (Signed)
Post Partum Day 1 Subjective: no complaints  Objective: Blood pressure (!) 124/92, pulse 86, temperature 98 F (36.7 C), temperature source Oral, resp. rate 18, height 5\' 2"  (1.575 m), weight 111.6 kg, last menstrual period 07/09/2022, SpO2 99%, unknown if currently breastfeeding. Vitals:   03/29/23 1520 03/29/23 2013 03/30/23 0006 03/30/23 0458  BP: 108/72 128/86 (!) 118/94 (!) 124/92  Pulse: 93 80 87 86  Resp: 17 18 18 18   Temp: 98.4 F (36.9 C) 98.2 F (36.8 C) 98 F (36.7 C) 98 F (36.7 C)  TempSrc: Oral Oral Oral Oral  SpO2: 99%  99%   Weight:      Height:        Physical Exam:  General: alert, cooperative, and appears stated age 37: appropriate Uterine Fundus: firm DVT Evaluation: No evidence of DVT seen on physical exam.  Recent Labs    03/28/23 0049 03/29/23 0602  HGB 11.2* 12.1  HCT 34.4* 36.0    Assessment/Plan: Discharge home, Breastfeeding, and Circumcision prior to discharge Condoms Lasix and Nifedipine   LOS: 2 days   Reva Bores, MD 03/30/2023, 7:59 AM

## 2023-03-30 NOTE — Progress Notes (Signed)
The Rn called MD due to patient's blood pressures increasing. MD ordered procardia for late am.

## 2023-03-31 MED ORDER — DOCUSATE SODIUM 100 MG PO CAPS: 100.0000 mg | ORAL_CAPSULE | Freq: Two times a day (BID) | ORAL | 0 refills | Status: DC | PRN

## 2023-03-31 NOTE — Lactation Note (Signed)
This note was copied from a baby's chart. Lactation Consultation Note  Patient Name: Otilia Karlen [id number] Date: 03/31/2023 Age:35 hours Reason for consult: Follow-up assessment;1st time breastfeeding;Early term 37-38.6wks  P1, Baby [redacted]w[redacted]d.  Baby sleeping after recent formula feeding of 42 ml.  Mother doubtful of her milk supply.  Reassured mother and recommend continuing to offer breast before formula and pump q 3 hours for 15 min.  Mother did not have questions at this time. Reviewed engorgement care and monitoring voids/stools.    Maternal Data Has patient been taught Hand Expression?: Yes  Feeding Mother's Current Feeding Choice: Breast Milk and Formula Nipple Type: Slow - flow  Interventions Interventions: Education  Discharge Discharge Education: Engorgement and breast care;Warning signs for feeding baby Pump: Personal;DEBP  Consult Status Consult Status: Complete Date: 03/31/23    Dahlia Byes [facility] 03/31/2023, 2:07 PM

## 2023-04-01 ENCOUNTER — Ambulatory Visit: Payer: BC Managed Care – PPO

## 2023-04-03 ENCOUNTER — Encounter: Payer: BC Managed Care – PPO | Admitting: Obstetrics and Gynecology

## 2023-04-04 ENCOUNTER — Ambulatory Visit: Payer: BC Managed Care – PPO

## 2023-04-08 ENCOUNTER — Inpatient Hospital Stay (HOSPITAL_COMMUNITY): Payer: BC Managed Care – PPO

## 2023-04-09 ENCOUNTER — Telehealth (HOSPITAL_COMMUNITY): Payer: Self-pay

## 2023-04-09 NOTE — Telephone Encounter (Signed)
04/09/2023 1408  Name: Julia Acosta MRN: [id number] DOB: 1986-04-22  Reason for Call:  Transition of Care Hospital Discharge Call  Contact Status: Patient Contact Status: Complete  Language assistant needed:          Follow-Up Questions: Do You Have Any Concerns About Your Health As You Heal From Delivery?: No Do You Have Any Concerns About Your Infants Health?: No  Edinburgh Postnatal Depression Scale:  In the Past 7 Days: I have been able to laugh and see the funny side of things.: As much as I always could I have looked forward with enjoyment to things.: As much as I ever did I have blamed myself unnecessarily when things went wrong.: No, never I have been anxious or worried for no good reason.: No, not at all I have felt scared or panicky for no good reason.: No, not at all Things have been getting on top of me.: No, most of the time I have coped quite well I have been so unhappy that I have had difficulty sleeping.: Not at all I have felt sad or miserable.: No, not at all I have been so unhappy that I have been crying.: No, never The thought of harming myself has occurred to me.: Never Inocente Salles Postnatal Depression Scale Total: 1  PHQ2-9 Depression Scale:     Discharge Follow-up: Edinburgh score requires follow up?: No Patient was advised of the following resources:: Support Group, Breastfeeding Support Group  Post-discharge interventions: Reviewed Newborn Safe Sleep Practices  Signature  Signe Colt

## 2023-04-10 ENCOUNTER — Encounter: Payer: BC Managed Care – PPO | Admitting: Obstetrics and Gynecology

## 2023-04-15 ENCOUNTER — Encounter: Payer: Self-pay | Admitting: Obstetrics & Gynecology

## 2023-04-15 ENCOUNTER — Ambulatory Visit: Payer: BC Managed Care – PPO | Admitting: Obstetrics & Gynecology

## 2023-04-15 VITALS — BP 118/83 | HR 83 | Ht 62.0 in | Wt 224.0 lb

## 2023-04-15 DIAGNOSIS — O139 Gestational [pregnancy-induced] hypertension without significant proteinuria, unspecified trimester: Secondary | ICD-10-CM

## 2023-04-15 DIAGNOSIS — O135 Gestational [pregnancy-induced] hypertension without significant proteinuria, complicating the puerperium: Secondary | ICD-10-CM | POA: Diagnosis not present

## 2023-04-15 NOTE — Progress Notes (Signed)
   Subjective:    Patient ID: Julia Acosta, female    DOB: 1985-09-16, 37 y.o.   MRN: [id number]  HPI  Patient is a 37 year old G3 para 3-0-0-3 who presents after delivery for a blood pressure check.  She finished her Lasix and is currently on nifedipine.  Her blood pressure is 119/83.  Diastolic elevated for AHA standards of hypertension.  We will continue the nifedipine until her postpartum visit.  Was not placed on postpartum Babyscripts.  Review of Systems  Constitutional: Negative.   Respiratory: Negative.    Cardiovascular: Negative.   Gastrointestinal: Negative.   Genitourinary: Negative.        Objective:   Physical Exam Vitals reviewed.  Constitutional:      General: She is not in acute distress.    Appearance: She is well-developed.  HENT:     Head: Normocephalic and atraumatic.  Eyes:     Conjunctiva/sclera: Conjunctivae normal.  Cardiovascular:     Rate and Rhythm: Normal rate.  Pulmonary:     Effort: Pulmonary effort is normal.  Skin:    General: Skin is warm and dry.  Neurological:     Mental Status: She is alert and oriented to person, place, and time.  Psychiatric:        Mood and Affect: Mood normal.    Vitals:   04/15/23 0938  BP: 118/83  Pulse: 83  Weight: 224 lb (101.6 kg)  Height: 5\' 2"  (1.575 m)       Assessment & Plan:  37 year old female status postdelivery with resolving gestational hypertension  Updated medication list Continue nifedipine daily. Not sexually active yet but plans to use condoms. Return for a 5 to 6-week postpartum visit.

## 2023-04-25 ENCOUNTER — Ambulatory Visit: Payer: BC Managed Care – PPO | Admitting: Obstetrics and Gynecology

## 2023-05-14 ENCOUNTER — Encounter: Payer: Self-pay | Admitting: Obstetrics and Gynecology

## 2023-05-14 ENCOUNTER — Ambulatory Visit (INDEPENDENT_AMBULATORY_CARE_PROVIDER_SITE_OTHER): Payer: BC Managed Care – PPO | Admitting: Obstetrics and Gynecology

## 2023-05-14 NOTE — Progress Notes (Signed)
 Post Partum Visit Note  Julia Acosta is a 37 y.o. G13P3003 female who presents for a postpartum visit. She is 6 weeks postpartum following a normal spontaneous vaginal delivery.  I have fully reviewed the prenatal and intrapartum course. The delivery was at 37.4 gestational weeks.  Anesthesia: epidural. Postpartum course has been unremarkable. Baby is doing well. Baby is feeding by breast. Bleeding no bleeding. Bowel function is normal. Bladder function is normal. Patient is not sexually active. Contraception method is condoms. Postpartum depression screening: negative.   The pregnancy intention screening data noted above was reviewed. Potential methods of contraception were discussed. The patient elected to proceed with No data recorded.   Edinburgh Postnatal Depression Scale - 05/14/23 1005       Edinburgh Postnatal Depression Scale:  In the Past 7 Days   I have been able to laugh and see the funny side of things. 0    I have looked forward with enjoyment to things. 0    I have blamed myself unnecessarily when things went wrong. 0    I have been anxious or worried for no good reason. 0    I have felt scared or panicky for no good reason. 0    Things have been getting on top of me. 1    I have been so unhappy that I have had difficulty sleeping. 0    I have felt sad or miserable. 0    I have been so unhappy that I have been crying. 0    The thought of harming myself has occurred to me. 0    Edinburgh Postnatal Depression Scale Total 1             Health Maintenance Due  Topic Date Due   COVID-19 Vaccine (1 - 2024-25 season) Never done    The following portions of the patient's history were reviewed and updated as appropriate: allergies, current medications, past family history, past medical history, past social history, past surgical history, and problem list.  Review of Systems Pertinent items are noted in HPI.  Objective:  BP 124/84   Pulse 96   Ht 5' 2 (1.575 m)    Wt 230 lb (104.3 kg)   Breastfeeding Yes   BMI 42.07 kg/m    General:  alert and cooperative   Breasts:  not indicated  Lungs: clear to auscultation bilaterally  Heart:  regular rate and rhythm, S1, S2 normal, no murmur, click, rub or gallop  Abdomen: soft, non-tender; bowel sounds normal; no masses,  no organomegaly        Assessment:   Normal postpartum exam.  Pap due 06/2023 Declines BC  Plan:   Essential components of care per ACOG recommendations:  1.  Mood and well being: Patient with negative depression screening today. Reviewed local resources for support.  - Patient tobacco use? No.   - hx of drug use? No.    2. Infant care and feeding:  -Patient currently breastmilk feeding? Yes. Reviewed importance of draining breast regularly to support lactation.  -Social determinants of health (SDOH) reviewed in EPIC. No concerns  3. Sexuality, contraception and birth spacing - Patient does not want a pregnancy in the next year.  Desired family size is 3 children.  - Reviewed reproductive life planning. Reviewed contraceptive methods based on pt preferences and effectiveness.  Patient desired Female Condom today.   - Discussed birth spacing of 18 months  4. Sleep and fatigue -Encouraged family/partner/community support of 4 hrs of  uninterrupted sleep to help with mood and fatigue  5. Physical Recovery  - Discussed patients delivery and complications. She describes her labor as mixed. - Patient had a Vaginal, no problems at delivery. Patient had a 2nd degree laceration. Perineal healing reviewed. Patient expressed understanding - Patient has urinary incontinence? No. - Patient is safe to resume physical and sexual activity  6.  Health Maintenance - HM due items addressed: gestational HTN - Last pap smear  Diagnosis  Date Value Ref Range Status  06/17/2020   Final   - Negative for intraepithelial lesion or malignancy (NILM)   Pap smear not done at today's visit.  -Breast  Cancer screening indicated? No.   7. Chronic Disease/Pregnancy Condition follow up: Hypertension  - PCP follow up  Delon Emms, NP Center for [employer], [facility]

## 2023-05-14 NOTE — Patient Instructions (Signed)
 You have constipation which is hard stools that are difficult to pass. It is important to have regular bowel movements every 1-3 days that are soft and easy to pass. Hard stools increase your risk of hemorrhoids and are very uncomfortable.   To prevent constipation you can increase the amount of fiber in your diet. Examples of foods with fiber are leafy greens, whole grain breads, oatmeal and other grains.  It is also important to drink at least eight 8oz glass of water everyday.   If you have not has a bowel movement in 4-5 days you made need to clean out your bowel.  This will have establish normal movement through your bowel.    Miralax Clean out  Take 8 capfuls of miralax in 64 oz of gatorade. You can use any fluid that appeals to you (gatorade, water, juice)  Continue to drink at least eight 8 oz glasses of water throughout the day  You can repeat with another 8 capfuls of miralax in 64 oz of gatorade if you are not having a large amount of stools  You will need to be at home and close to a bathroom for about 8 hours when you do the above as you may need to go to the bathroom frequently.   After you are cleaned out: - Start Colace100mg  twice daily - Start Miralax once daily - Start a daily fiber supplement like metamucil or citrucel - You can safely use enemas in pregnancy  - if you are having diarrhea you can reduce to Colace once a day or miralax every other day or a 1/2 capful daily.
